# Patient Record
Sex: Female | Born: 1950 | Race: White | Hispanic: No | Marital: Single | Smoking: Former smoker
Health system: Southern US, Community
[De-identification: ages and names within clinical notes are randomized; demographics above are authoritative.]

## PROBLEM LIST (undated history)

## (undated) DIAGNOSIS — E079 Disorder of thyroid, unspecified: Secondary | ICD-10-CM

## (undated) DIAGNOSIS — Z87440 Personal history of urinary (tract) infections: Secondary | ICD-10-CM

## (undated) DIAGNOSIS — R51 Headache: Secondary | ICD-10-CM

## (undated) DIAGNOSIS — E049 Nontoxic goiter, unspecified: Secondary | ICD-10-CM

## (undated) DIAGNOSIS — Z923 Personal history of irradiation: Secondary | ICD-10-CM

## (undated) DIAGNOSIS — C50919 Malignant neoplasm of unspecified site of unspecified female breast: Secondary | ICD-10-CM

## (undated) DIAGNOSIS — Z1379 Encounter for other screening for genetic and chromosomal anomalies: Secondary | ICD-10-CM

## (undated) DIAGNOSIS — R112 Nausea with vomiting, unspecified: Secondary | ICD-10-CM

## (undated) DIAGNOSIS — Z9221 Personal history of antineoplastic chemotherapy: Secondary | ICD-10-CM

## (undated) DIAGNOSIS — Z9889 Other specified postprocedural states: Secondary | ICD-10-CM

## (undated) HISTORY — PX: BREAST BIOPSY: SHX20

## (undated) HISTORY — DX: Encounter for other screening for genetic and chromosomal anomalies: Z13.79

---

## 1983-12-19 HISTORY — PX: TUMOR REMOVAL: SHX12

## 1984-12-18 HISTORY — PX: CERVICAL ABLATION: SHX5771

## 2016-03-18 DIAGNOSIS — R519 Headache, unspecified: Secondary | ICD-10-CM

## 2016-03-18 HISTORY — DX: Headache, unspecified: R51.9

## 2016-04-13 DIAGNOSIS — R928 Other abnormal and inconclusive findings on diagnostic imaging of breast: Secondary | ICD-10-CM | POA: Diagnosis not present

## 2016-04-20 DIAGNOSIS — C50919 Malignant neoplasm of unspecified site of unspecified female breast: Secondary | ICD-10-CM

## 2016-04-20 DIAGNOSIS — C50411 Malignant neoplasm of upper-outer quadrant of right female breast: Secondary | ICD-10-CM | POA: Diagnosis not present

## 2016-04-20 DIAGNOSIS — N63 Unspecified lump in breast: Secondary | ICD-10-CM | POA: Diagnosis not present

## 2016-04-20 HISTORY — DX: Malignant neoplasm of unspecified site of unspecified female breast: C50.919

## 2016-05-10 ENCOUNTER — Other Ambulatory Visit: Payer: Self-pay | Admitting: General Surgery

## 2016-05-10 DIAGNOSIS — C50411 Malignant neoplasm of upper-outer quadrant of right female breast: Secondary | ICD-10-CM | POA: Diagnosis not present

## 2016-05-10 DIAGNOSIS — E785 Hyperlipidemia, unspecified: Secondary | ICD-10-CM | POA: Diagnosis not present

## 2016-05-10 DIAGNOSIS — E049 Nontoxic goiter, unspecified: Secondary | ICD-10-CM | POA: Diagnosis not present

## 2016-05-11 ENCOUNTER — Encounter: Payer: Self-pay | Admitting: Hematology and Oncology

## 2016-05-12 ENCOUNTER — Telehealth: Payer: Self-pay | Admitting: *Deleted

## 2016-05-12 ENCOUNTER — Encounter: Payer: Self-pay | Admitting: Hematology and Oncology

## 2016-05-12 ENCOUNTER — Other Ambulatory Visit: Payer: Self-pay | Admitting: General Surgery

## 2016-05-12 DIAGNOSIS — C50411 Malignant neoplasm of upper-outer quadrant of right female breast: Secondary | ICD-10-CM

## 2016-05-12 NOTE — Telephone Encounter (Signed)
Received appt date/time from Isaiah Blakes.  Placed a note for an intake form to be given to the pt at time of check in.

## 2016-05-12 NOTE — Progress Notes (Signed)
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 Renelda Loma Ingram,MD 05/26/16 scheduled for Right breast Lumpectomy with radioactive seed localization and right axillary   Past/Anticipated interventions by medical oncology, if any: Chemotherapy :Dr. Lindi Adie to see patient 05/18/16 @ 3:45pm  Lymphedema issues, if any:  NO  Pain issues, if any: NO  SAFETY ISSUES: NO  Prior radiation?  NO  Pacemaker/ICD? NO  Possible current pregnancy? NO  Is the patient on methotrexate? NO  Current Complaints / other details: Single,  Menarche age 20, G50PO, hx oral contraceptives, drinks alcohol moderate use, former cigarette smoker,1/2ppd,  quit 33 years ago, Father prostate cancer, Mother,HTN Maternal grandmother  Ovarian cancer ,Great Maternal  aunt breast cancer,  C50.411   Allergies: NKA Rebecca Eaton, RN 05/12/2016,3:00 PM BP 134/66 mmHg  Pulse 59  Temp(Src) 98.5 F (36.9 C) (Oral)  Resp 20  Ht 5' 7"  (1.702 m)  Wt 174 lb 12.8 oz (79.289 kg)  BMI 27.37 kg/m2  LMP  (LMP Unknown)  Wt Readings from Last 3 Encounters:  05/16/16 174 lb 12.8 oz (79.289 kg)

## 2016-05-16 ENCOUNTER — Encounter: Payer: Self-pay | Admitting: Radiation Oncology

## 2016-05-16 ENCOUNTER — Ambulatory Visit
Admission: RE | Admit: 2016-05-16 | Discharge: 2016-05-16 | Disposition: A | Discharge status: Home / Self Care | Payer: PPO | Source: Ambulatory Visit | Attending: Radiation Oncology | Admitting: Radiation Oncology

## 2016-05-16 VITALS — BP 134/66 | HR 59 | Temp 98.5°F | Resp 20 | Ht 67.0 in | Wt 174.8 lb

## 2016-05-16 DIAGNOSIS — C50411 Malignant neoplasm of upper-outer quadrant of right female breast: Secondary | ICD-10-CM | POA: Insufficient documentation

## 2016-05-16 DIAGNOSIS — Z170 Estrogen receptor positive status [ER+]: Secondary | ICD-10-CM | POA: Diagnosis not present

## 2016-05-16 DIAGNOSIS — E079 Disorder of thyroid, unspecified: Secondary | ICD-10-CM | POA: Diagnosis not present

## 2016-05-16 DIAGNOSIS — Z87891 Personal history of nicotine dependence: Secondary | ICD-10-CM | POA: Insufficient documentation

## 2016-05-16 DIAGNOSIS — C50911 Malignant neoplasm of unspecified site of right female breast: Secondary | ICD-10-CM | POA: Diagnosis not present

## 2016-05-16 HISTORY — DX: Nontoxic goiter, unspecified: E04.9

## 2016-05-16 HISTORY — DX: Disorder of thyroid, unspecified: E07.9

## 2016-05-16 HISTORY — DX: Malignant neoplasm of unspecified site of unspecified female breast: C50.919

## 2016-05-16 NOTE — Progress Notes (Signed)
Please see the Nurse Progress Note in the MD Initial Consult Encounter for this patient. 

## 2016-05-16 NOTE — Addendum Note (Signed)
Encounter addended by: Doreen Beam, RN on: 05/16/2016 12:23 PM<BR>     Documentation filed: Charges VN

## 2016-05-16 NOTE — Progress Notes (Signed)
Radiation Oncology         [phone] ________________________________  Name: Mercedes Nelson MRN: [id number]  Date: 05/16/2016  DOB: 1951-01-26  CC:No primary care provider on file.  Fanny Skates, MD     REFERRING PHYSICIAN: Fanny Skates, MD   DIAGNOSIS: Clinical Stage Ia, T1c, N0, M0, ER/PR positive, HER-2 negative, invasive ductal carcinoma of the right breast.   HISTORY OF PRESENT ILLNESS: Mercedes Nelson is a 65 y.o. female seen at the request of Dr. Dalbert Batman for a new diagnosis of breast cancer. The patient has never had an abnormality on her mammography. On 4/191/17 she went for a bilateral screening mammogram which revealed an anomaly of her right breast. She underwent spot compression and ultrasound, the results of which are gleaned from Dr. Darrel Hoover note. She was found to have a 1.1 cm lesion in the right upper outer quadrant of the right breast on ultrasound without axillary adenopathy. She had a biopsy on 04/20/16 which showed ER/PR positive, HER-2 negative, invasive ductal carcinoma, and she has met with Dr. Dalbert Batman and will undergo seed localization, right lumpectomy and sentinel lymph node assessment. She is scheduled for this on 05/26/16. She comes today to discuss the role of radiotherapy with Dr. Lisbeth Renshaw.    PREVIOUS RADIATION THERAPY: No   PAST MEDICAL HISTORY:  Past Medical History  Diagnosis Date  . Thyroid disease   . Breast cancer ([place]) 54/17    Right breast invasive ductal ca  . Goiter        PAST SURGICAL HISTORY:No past surgical history on file.   FAMILY HISTORY:  Family History  Problem Relation Age of Onset  . Hypertension Mother   . Hypertension Father   . Hypertension Sister   . Hypertension Brother   . Cancer Brother     prostate  . Cancer Maternal Grandmother     ovarian  . Cancer Other     breast     SOCIAL HISTORY:  reports that she has quit smoking. Her smoking use included Cigarettes. She has a 2 pack-year smoking history. She has  never used smokeless tobacco. She reports that she drinks alcohol.   ALLERGIES: Review of patient's allergies indicates no known allergies.   MEDICATIONS:  Current Outpatient Prescriptions  Medication Sig Dispense Refill  . Multiple Vitamin (MULTIVITAMIN) tablet Take 1 tablet by mouth daily.    . naproxen sodium (ANAPROX) 220 MG tablet Take 220 mg by mouth as needed.    . simvastatin (ZOCOR) 40 MG tablet Take 40 mg by mouth daily at 6 PM.     No current facility-administered medications for this encounter.     REVIEW OF SYSTEMS: On review of systems, the patient reports that she is doing well overall. She denies any breast pain, nipple bleeding, discharge, or significant bruising. She denies any chest pain, shortness of breath, cough, fevers, chills, night sweats, unintended weight changes. She denies any bowel or bladder disturbances, and denies abdominal pain, nausea or vomiting. She denies any new musculoskeletal or joint aches or pains. A complete review of systems is obtained and is otherwise negative.     PHYSICAL EXAM:  height is _0  (1.702 m) and weight is 174 lb 12.8 oz (79.289 kg). Her oral temperature is 98.5 F (36.9 C). Her blood pressure is 134/66 and her pulse is 59. Her respiration is 20.   Pain scale 0/10 In general this is a well appearing Caucasian female in no acute distress. She is alert and oriented x4  and appropriate throughout the examination. HEENT reveals that the patient is normocephalic, atraumatic. EOMs are intact. PERRLA. Skin is intact without any evidence of gross lesions. Cardiovascular exam reveals a regular rate and rhythm, no clicks rubs or murmurs are auscultated. Chest is clear to auscultation bilaterally. Lymphatic assessment is performed and does not reveal any adenopathy in the cervical, supraclavicular, axillary, or inguinal chains. Bilateral breasts are examined. The right breast reveals postbiopsy change without ecchymosis of the right upper outer  quadrant. No palpable masses are noted. The left breast is also examined and does not have any palpable abnormalities. No bleeding or nipple discharge is noted of either breast. Abdomen has active bowel sounds in all quadrants and is intact. The abdomen is soft, non tender, non distended. Lower extremities are negative for pretibial pitting edema, deep calf tenderness, cyanosis or clubbing.   ECOG = 0  0 - Asymptomatic (Fully active, able to carry on all predisease activities without restriction)  1 - Symptomatic but completely ambulatory (Restricted in physically strenuous activity but ambulatory and able to carry out work of a light or sedentary nature. For example, light housework, office work)  2 - Symptomatic, <50% in bed during the day (Ambulatory and capable of all self care but unable to carry out any work activities. Up and about more than 50% of waking hours)  3 - Symptomatic, >50% in bed, but not bedbound (Capable of only limited self-care, confined to bed or chair 50% or more of waking hours)  4 - Bedbound (Completely disabled. Cannot carry on any self-care. Totally confined to bed or chair)  5 - Death   Eustace Pen MM, Creech RH, Tormey DC, et al. [phone]). "Toxicity and response criteria of the [facility]". [place] Oncol. 5 (6): 649-55    LABORATORY DATA:  No results found for: WBC, HGB, HCT, MCV, PLT No results found for: NA, K, CL, CO2 No results found for: ALT, AST, GGT, ALKPHOS, BILITOT    RADIOGRAPHY: No results found.     IMPRESSION: Clinical Stage Ia, T1c, N0, M0, ER/PR positive, HER-2 negative, invasive ductal carcinoma of the right breast.    PLAN: Dr. Lisbeth Renshaw discusses the pathology findings and reviews the nature of invasive breast disease. He discusses the recommendation for breast conservation surgery as outlined by Dr. Dalbert Batman. He discusses the rationale for radiotherapy to decrease the risk of recurrent disease as well. He discusses that  radiotherapy would also be followed by antiestrogen therapy. The patient will meet with Dr. Lindi Adie Thursday of this week and will discuss whether or not there is an indication for chemotherapy as well. Dr. Lisbeth Renshaw discusses that radiation would follow chemotherapy also if this was indicated. For the radiation treatments,  he would recommend a hypofractionated course of radiation given daily for 20 fractions over 4 weeks. He discusses the risks, benefits, short, and long term effects of treatment. She states agreement and understanding of this plan. We will coordinate her care following surgery.   I have also broached the topic of genetic testing with the patient given her own history of cancer with two family members who had ovarian cancer, and the other with breast cancer. She will consider her options and let us know or Dr. Lindi Adie know if she would like to speak with genetics.  The above documentation reflects my direct findings during this shared patient visit. Please see the separate note by Dr. Lisbeth Renshaw on this date for the remainder of the patient's plan of care.  Carola Rhine, PAC

## 2016-05-18 ENCOUNTER — Encounter: Payer: Self-pay | Admitting: Hematology and Oncology

## 2016-05-18 ENCOUNTER — Encounter: Payer: Self-pay | Admitting: *Deleted

## 2016-05-18 ENCOUNTER — Ambulatory Visit (HOSPITAL_BASED_OUTPATIENT_CLINIC_OR_DEPARTMENT_OTHER): Payer: PPO | Admitting: Hematology and Oncology

## 2016-05-18 ENCOUNTER — Other Ambulatory Visit: Payer: Self-pay

## 2016-05-18 VITALS — BP 123/62 | HR 60 | Temp 98.6°F | Resp 19 | Ht 67.0 in | Wt 172.9 lb

## 2016-05-18 DIAGNOSIS — C50411 Malignant neoplasm of upper-outer quadrant of right female breast: Secondary | ICD-10-CM

## 2016-05-18 DIAGNOSIS — Z170 Estrogen receptor positive status [ER+]: Secondary | ICD-10-CM

## 2016-05-18 NOTE — Assessment & Plan Note (Signed)
04/05/2016: Asymmetry in the right breast lateral middle depth, 11 mm non-circumscribed mass ([facility] mammogram) Right breast biopsy: Invasive ductal carcinoma ER 90-100%, PR 20%, Ki-67 20%, HER-2 Neg, T1b N0 stage IA clinical stage  Pathology and radiology counseling:Discussed with the patient, the details of pathology including the type of breast cancer,the clinical staging, the significance of ER, PR and HER-2/neu receptors and the implications for treatment. After reviewing the pathology in detail, we proceeded to discuss the different treatment options between surgery, radiation, chemotherapy, antiestrogen therapies.  Recommendations: Genetics consultation (maternal grandmother ovarian cancer) 1. Breast conserving surgery followed by 2. Oncotype DX testing to determine if chemotherapy would be of any benefit followed by 3. Adjuvant radiation therapy followed by 4. Adjuvant antiestrogen therapy  Oncotype counseling: I discussed Oncotype DX test. I explained to the patient that this is a 21 gene panel to evaluate patient tumors DNA to calculate recurrence score. This would help determine whether patient has high risk or intermediate risk or low risk breast cancer. She understands that if her tumor was found to be high risk, she would benefit from systemic chemotherapy. If low risk, no need of chemotherapy. If she was found to be intermediate risk, we would need to evaluate the score as well as other risk factors and determine if an abbreviated chemotherapy may be of benefit.  Return to clinic after surgery to discuss final pathology report and then determine if Oncotype DX testing will need to be sent.

## 2016-05-18 NOTE — Progress Notes (Signed)
She is at [place]  Patient Care Team: Pcp Not In System as PCP - General   Consulting physician: Dr. Dalbert Batman  CHIEF COMPLAINTS/PURPOSE OF CONSULTATION:  Newly diagnosed breast cancer  HISTORY OF PRESENTING ILLNESS:  Mercedes Nelson 65 y.o. female is here because of recent diagnosis of right breast cancer. Patient had a routine screening mammogram that revealed an asymmetry in the right breast it was 11 mm by ultrasound. Ultrasound-guided biopsy was performed which revealed invasive ductal carcinoma that was ER/PR positive HER-2 negative with a Ki-67 of 20%. She was seen by Dr. Dalbert Batman who consented her for lumpectomy surgery. She is here to discuss overall treatment plan. 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 I reviewed her records extensively and collaborated the history with the patient.  SUMMARY OF ONCOLOGIC HISTORY:   Malignant neoplasm of upper-outer quadrant of right female breast ([place])   04/05/2016 Mammogram Asymmetry in the right breast lateral middle depth, 11 mm non-circumscribed mass ([facility] mammogram)   04/05/2016 Initial Diagnosis Right breast biopsy: Invasive ductal carcinoma ER 90-100%, PR 20%, Ki-67 20%, HER-2 Neg, T1b N0 stage IA clinical stage   MEDICAL HISTORY:  Past Medical History  Diagnosis Date  . Thyroid disease   . Breast cancer ([place]) 04/20/16    Right breast invasive ductal ca  . Goiter     SURGICAL HISTORY: No past surgical history on file.  SOCIAL HISTORY: Social History   Social History  . Marital Status: Single    Spouse Name: N/A  . Number of Children: N/A  . Years of Education: N/A   Occupational History  . retired     Former Corporate treasurer   Social History Main Topics  . Smoking status: Former Smoker -- 0.50 packs/day for 4 years    Types: Cigarettes  . Smokeless tobacco: Never Used     Comment: quit 33 years ago  . Alcohol Use: 0.0 oz/week    0  Standard drinks or equivalent per week     Comment: glass wine or beer daily  . Drug Use: No  . Sexual Activity: Not on file   Other Topics Concern  . Not on file   Social History Narrative    FAMILY HISTORY: Family History  Problem Relation Age of Onset  . Hypertension Mother   . Hypertension Father   . Hypertension Sister   . Hypertension Brother   . Cancer Father     prostate  . Cancer Maternal Grandmother     ovarian  . Cancer Other     breast    ALLERGIES:  has No Known Allergies.  MEDICATIONS:  Current Outpatient Prescriptions  Medication Sig Dispense Refill  . Multiple Vitamin (MULTIVITAMIN) tablet Take 1 tablet by mouth daily.    . naproxen sodium (ANAPROX) 220 MG tablet Take 220 mg by mouth as needed.    . simvastatin (ZOCOR) 40 MG tablet Take 40 mg by mouth daily at 6 PM.     No current facility-administered medications for this visit.    REVIEW OF SYSTEMS:   Constitutional: Denies fevers, chills or abnormal night sweats Eyes: Denies blurriness of vision, double vision or watery eyes Ears, nose, mouth, throat, and face: Denies mucositis or sore throat Respiratory: Denies cough, dyspnea or wheezes Cardiovascular: Denies palpitation, chest discomfort or lower extremity swelling Gastrointestinal:  Denies nausea, heartburn or change in bowel habits Skin: Denies abnormal skin rashes Lymphatics: Denies new lymphadenopathy  or easy bruising Neurological:Denies numbness, tingling or new weaknesses Behavioral/Psych: Mood is stable, no new changes  Breast:  Denies any palpable lumps or discharge All other systems were reviewed with the patient and are negative.  PHYSICAL EXAMINATION: ECOG PERFORMANCE STATUS: 0 - Asymptomatic  Filed Vitals:   05/18/16 1250  BP: 123/62  Pulse: 60  Temp: 98.6 F (37 C)  Resp: 19   Filed Weights   05/18/16 1250  Weight: 172 lb 14.4 oz (78.427 kg)    GENERAL:alert, no distress and comfortable SKIN: skin color, texture,  turgor are normal, no rashes or significant lesions EYES: normal, conjunctiva are pink and non-injected, sclera clear OROPHARYNX:no exudate, no erythema and lips, buccal mucosa, and tongue normal  NECK: supple, thyroid normal size, non-tender, without nodularity LYMPH:  no palpable lymphadenopathy in the cervical, axillary or inguinal LUNGS: clear to auscultation and percussion with normal breathing effort HEART: regular rate & rhythm and no murmurs and no lower extremity edema ABDOMEN:abdomen soft, non-tender and normal bowel sounds Musculoskeletal:no cyanosis of digits and no clubbing  PSYCH: alert & oriented x 3 with fluent speech NEURO: no focal motor/sensory deficits BREAST: No palpable nodules in breast. No palpable axillary or supraclavicular lymphadenopathy (exam performed in the presence of a chaperone)   RADIOGRAPHIC STUDIES: I have personally reviewed the radiological reports and agreed with the findings in the report.  ASSESSMENT AND PLAN:  Malignant neoplasm of upper-outer quadrant of right female breast ([name]) 04/05/2016: Asymmetry in the right breast lateral middle depth, 11 mm non-circumscribed mass ([facility] mammogram) Right breast biopsy: Invasive ductal carcinoma ER 90-100%, PR 20%, Ki-67 20%, HER-2 Neg, T1b N0 stage IA clinical stage  Pathology and radiology counseling:Discussed with the patient, the details of pathology including the type of breast cancer,the clinical staging, the significance of ER, PR and HER-2/neu receptors and the implications for treatment. After reviewing the pathology in detail, we proceeded to discuss the different treatment options between surgery, radiation, chemotherapy, antiestrogen therapies.  Recommendations: Genetics consultation (maternal grandmother ovarian cancer) 1. Breast conserving surgery followed by 2. Oncotype DX testing to determine if chemotherapy would be of any benefit followed by 3. Adjuvant radiation therapy followed by 4.  Adjuvant antiestrogen therapy  Oncotype counseling: I discussed Oncotype DX test. I explained to the patient that this is a 21 gene panel to evaluate patient tumors DNA to calculate recurrence score. This would help determine whether patient has high risk or intermediate risk or low risk breast cancer. She understands that if her tumor was found to be high risk, she would benefit from systemic chemotherapy. If low risk, no need of chemotherapy. If she was found to be intermediate risk, we would need to evaluate the score as well as other risk factors and determine if an abbreviated chemotherapy may be of benefit.  Return to clinic after surgery to discuss final pathology report and then determine if Oncotype DX testing will need to be sent.  All questions were answered. The patient knows to call the clinic with any problems, questions or concerns.    Rulon Eisenmenger, MD 05/18/2016

## 2016-05-19 NOTE — Progress Notes (Signed)
Note created by Dr. Gudena during office visit, copy to patient,original to scan. 

## 2016-05-21 NOTE — H&P (Signed)
Mercedes Nelson. Hall Busing  Location: [facility] Patient #: [id number] DOB: Jul 26, 1951 Single / Language: Undefined / Race: White Female       History of Present Illness This is a 65 year old Caucasian female, referred to me by her PCP Dr. Barbette Hair and for evaluation of a new right breast cancer.  The patient used to live in [place] and lives here now. 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 She gets them annually. 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 Her insurance requires her to go to come hospital and asked what she wants to do because she lives in [place] now.  Past history is significant for hyperlipidemia. She's had a goiter all of her life and states that her thyroid function tests are normal and ultrasounds have showed no nodules in the past.  Family history reveals ovarian cancer in a maternal grandmother. Breast cancer in a great aunt. No pancreatic or prostate cancer.  Social history reveals she is single and lives in [place] has no children lives alone. A sister and a mother living [place]. She is a retired Corporate treasurer. Denies tobacco. Has 1 alcoholic beverage a day. She has a vacation planned from June 24 through July 1.  We spent a long time talking about her cancer. I told her that clinically she was a stage IA cancer. The doesn't seem to be any indication for MRI. We are going to send for her baseline screening imaging studies, [facility] read. We will get her last note from her primary care physician. Iris Pert, RN at the [facility] has been contacted and will refer her to one of our medical oncologists and one of our radiation  oncologists.  We had a long talk about management of breast cancer in general and hers in detail. She we talked whether diverting lumpectomy and mastectomy. We talked about reconstruction. We talked about sentinel lymph node biopsy. She is essentially lumpectomy and I think she is an excellent candidate for that. She will be tentatively scheduled for right breast lumpectomy with radioactive seed localization and right axillary sentinel lymph node biopsy. In the meantime we'll get a preoperative medical oncology and radiation oncology consultation. We'll check her TSH preop.   Other Problems  Breast Cancer General anesthesia - complications Hypercholesterolemia Lump In Breast Migraine Headache Thyroid Disease  Past Surgical History Breast Biopsy Right.  Diagnostic Studies History  Colonoscopy 1-5 years ago Mammogram within last year Pap Smear 1-5 years ago  Allergies  No Known Drug Allergies05/24/2017  Medication History  Simvastatin (40MG Tablet, Oral) Active. Medications Reconciled  Social History  Alcohol use Moderate alcohol use. Caffeine use Coffee. No drug use Tobacco use Former smoker.  Family History  Heart Disease Father. Hypertension Brother, Father, Mother, Sister. Prostate Cancer Father.  Pregnancy / Birth History  Age at menarche 48 years. Age of menopause 51-55 Contraceptive History Oral contraceptives. Gravida 0 Irregular periods Para 0    Review of Systems General Not Present- Appetite Loss, Chills, Fatigue, Fever, Night Sweats, Weight Gain and Weight Loss. Skin Not Present- Change in Wart/Mole, Dryness, Hives, Jaundice, New Lesions, Non-Healing Wounds, Rash and Ulcer. HEENT Present- Wears glasses/contact lenses. Not Present- Earache, Hearing Loss, Hoarseness, Nose Bleed, Oral  Ulcers, Ringing in the Ears, Seasonal Allergies, Sinus Pain, Sore Throat, Visual Disturbances and Yellow Eyes. Respiratory Not Present- Bloody  sputum, Chronic Cough, Difficulty Breathing, Snoring and Wheezing. Breast Present- Breast Mass. Not Present- Breast Pain, Nipple Discharge and Skin Changes. Cardiovascular Not Present- Chest Pain, Difficulty Breathing Lying Down, Leg Cramps, Palpitations, Rapid Heart Rate, Shortness of Breath and Swelling of Extremities. Gastrointestinal Not Present- Abdominal Pain, Bloating, Bloody Stool, Change in Bowel Habits, Chronic diarrhea, Constipation, Difficulty Swallowing, Excessive gas, Gets full quickly at meals, Hemorrhoids, Indigestion, Nausea, Rectal Pain and Vomiting. Female Genitourinary Not Present- Frequency, Nocturia, Painful Urination, Pelvic Pain and Urgency. Musculoskeletal Not Present- Back Pain, Joint Pain, Joint Stiffness, Muscle Pain, Muscle Weakness and Swelling of Extremities. Neurological Not Present- Decreased Memory, Fainting, Headaches, Numbness, Seizures, Tingling, Tremor, Trouble walking and Weakness. Psychiatric Not Present- Anxiety, Bipolar, Change in Sleep Pattern, Depression, Fearful and Frequent crying. Endocrine Not Present- Cold Intolerance, Excessive Hunger, Hair Changes, Heat Intolerance, Hot flashes and New Diabetes. Hematology Not Present- Easy Bruising, Excessive bleeding, Gland problems, HIV and Persistent Infections.  Vitals  Weight: 172 lb Height: 67in Body Surface Area: 1.9 m Body Mass Index: 26.94 kg/m  Temp.: 97F(Temporal)  Pulse: 78 (Regular)  BP: 120/70 (Sitting, Left Arm, Standard)     Physical Exam  General Mental Status-Alert. General Appearance-Consistent with stated age. Hydration-Well hydrated. Voice-Normal. Note: Very pleasant and cooperative. Very soft spoken.   Head and Neck Head-normocephalic, atraumatic with no lesions or palpable masses. Trachea-midline. Thyroid Gland Characteristics - normal size and consistency.  Eye Eyeball - Bilateral-Extraocular movements intact. Sclera/Conjunctiva -  Bilateral-No scleral icterus.  Chest and Lung Exam Chest and lung exam reveals -quiet, even and easy respiratory effort with no use of accessory muscles and on auscultation, normal breath sounds, no adventitious sounds and normal vocal resonance. Inspection Chest Wall - Normal. Back - normal.  Breast Note: Medium size breasts. Biopsy scar laterally. Slight thickening at the 11 o'clock position possibly the tumor or possibly a small hematoma. No other mass or skin changes in either breast. No axillary adenopathy.   Cardiovascular Cardiovascular examination reveals -normal heart sounds, regular rate and rhythm with no murmurs and normal pedal pulses bilaterally.  Abdomen Inspection Inspection of the abdomen reveals - No Hernias. Skin - Scar - no surgical scars. Palpation/Percussion Palpation and Percussion of the abdomen reveal - Soft, Non Tender, No Rebound tenderness, No Rigidity (guarding) and No hepatosplenomegaly. Auscultation Auscultation of the abdomen reveals - Bowel sounds normal.  Neurologic Neurologic evaluation reveals -alert and oriented x 3 with no impairment of recent or remote memory. Mental Status-Normal.  Musculoskeletal Normal Exam - Left-Upper Extremity Strength Normal and Lower Extremity Strength Normal. Normal Exam - Right-Upper Extremity Strength Normal and Lower Extremity Strength Normal.  Lymphatic Head & Neck  General Head & Neck Lymphatics: Bilateral - Description - Normal. Axillary  General Axillary Region: Bilateral - Description - Normal. Tenderness - Non Tender. Femoral & Inguinal  Generalized Femoral & Inguinal Lymphatics: Bilateral - Description - Normal. Tenderness - Non Tender.    Assessment & Plan  BREAST CANCER OF UPPER-OUTER QUADRANT OF RIGHT FEMALE BREAST (C50.411)    Your recent imaging studies and biopsies in [facility] sure that you have a 1.1 cm invasive ductal carcinoma in the right breast, upper outer  quadrant quadrant. Ultrasound of your right axillary lymph nodes looked normal. Your tumor is positive for estrogen and progesterone receptor sensitivity, and a HER-2 test is probably negative.  We have talked about options for surgical  intervention which include lumpectomy or mastectomy, sentinel lymph node biopsy. We have talked about radiation therapy and medical oncology referral. After discussing this at length, we have agreed to schedule you for right breast lumpectomy with radioactive seed localization and right axillary sentinel lymph node biopsy We have discussed the indications, techniques, and numerous risks of the surgery  prior to the surgery we are going to obtain the bilateral screening mammograms and your pathology slides from [facility]. We are also going to refer you to medical oncology and radiation oncology for a preoperative consultation  Please read over the printed information that we gave you  HYPERLIPIDEMIA, MILD (E78.5) GOITER (E04.9) Impression: Euthyroid by history.   Edsel Petrin. Dalbert Batman, M.D., [facility] General and Minimally invasive Surgery Breast and Colorectal Surgery Office:   [phone] Pager:   [phone]

## 2016-05-22 ENCOUNTER — Other Ambulatory Visit: Payer: Self-pay | Admitting: General Surgery

## 2016-05-22 DIAGNOSIS — C50411 Malignant neoplasm of upper-outer quadrant of right female breast: Secondary | ICD-10-CM | POA: Diagnosis not present

## 2016-05-24 ENCOUNTER — Encounter (HOSPITAL_COMMUNITY): Payer: Self-pay

## 2016-05-24 ENCOUNTER — Encounter (HOSPITAL_COMMUNITY)
Admission: RE | Admit: 2016-05-24 | Discharge: 2016-05-24 | Disposition: A | Discharge status: Home / Self Care | Payer: PPO | Source: Ambulatory Visit | Attending: General Surgery | Admitting: General Surgery

## 2016-05-24 DIAGNOSIS — C50411 Malignant neoplasm of upper-outer quadrant of right female breast: Secondary | ICD-10-CM | POA: Diagnosis not present

## 2016-05-24 DIAGNOSIS — Z87891 Personal history of nicotine dependence: Secondary | ICD-10-CM | POA: Diagnosis not present

## 2016-05-24 DIAGNOSIS — E7800 Pure hypercholesterolemia, unspecified: Secondary | ICD-10-CM | POA: Diagnosis not present

## 2016-05-24 DIAGNOSIS — Z170 Estrogen receptor positive status [ER+]: Secondary | ICD-10-CM | POA: Diagnosis not present

## 2016-05-24 DIAGNOSIS — Z8041 Family history of malignant neoplasm of ovary: Secondary | ICD-10-CM | POA: Diagnosis not present

## 2016-05-24 DIAGNOSIS — Z803 Family history of malignant neoplasm of breast: Secondary | ICD-10-CM | POA: Diagnosis not present

## 2016-05-24 HISTORY — DX: Headache: R51

## 2016-05-24 HISTORY — DX: Other specified postprocedural states: R11.2

## 2016-05-24 HISTORY — DX: Other specified postprocedural states: Z98.890

## 2016-05-24 LAB — COMPREHENSIVE METABOLIC PANEL
ALBUMIN: 3.9 g/dL (ref 3.5–5.0)
ALT: 24 U/L (ref 14–54)
AST: 22 U/L (ref 15–41)
Alkaline Phosphatase: 59 U/L (ref 38–126)
Anion gap: 7 (ref 5–15)
BUN: 15 mg/dL (ref 6–20)
CHLORIDE: 106 mmol/L (ref 101–111)
CO2: 27 mmol/L (ref 22–32)
Calcium: 9.6 mg/dL (ref 8.9–10.3)
Creatinine, Ser: 0.79 mg/dL (ref 0.44–1.00)
GFR calc Af Amer: >60 mL/min (ref >60)
GLUCOSE: 104 mg/dL — AB (ref 65–99)
POTASSIUM: 4 mmol/L (ref 3.5–5.1)
Sodium: 140 mmol/L (ref 135–145)
Total Bilirubin: 1.2 mg/dL (ref 0.3–1.2)
Total Protein: 6.1 g/dL — ABNORMAL LOW (ref 6.5–8.1)

## 2016-05-24 LAB — CBC WITH DIFFERENTIAL/PLATELET
BASOS ABS: 0 10*3/uL (ref 0.0–0.1)
BASOS PCT: 0 %
EOS PCT: 1 %
Eosinophils Absolute: 0 10*3/uL (ref 0.0–0.7)
HCT: 42.8 % (ref 36.0–46.0)
Hemoglobin: 13.8 g/dL (ref 12.0–15.0)
Lymphocytes Relative: 16 %
Lymphs Abs: 1.4 10*3/uL (ref 0.7–4.0)
MCH: 29 pg (ref 26.0–34.0)
MCHC: 32.2 g/dL (ref 30.0–36.0)
MCV: 89.9 fL (ref 78.0–100.0)
MONO ABS: 0.4 10*3/uL (ref 0.1–1.0)
Monocytes Relative: 5 %
Neutro Abs: 6.8 10*3/uL (ref 1.7–7.7)
Neutrophils Relative %: 78 %
PLATELETS: 214 10*3/uL (ref 150–400)
RBC: 4.76 MIL/uL (ref 3.87–5.11)
RDW: 12.8 % (ref 11.5–15.5)
WBC: 8.7 10*3/uL (ref 4.0–10.5)

## 2016-05-24 LAB — TSH: TSH: 0.888 u[IU]/mL (ref 0.350–4.500)

## 2016-05-24 NOTE — Pre-Procedure Instructions (Signed)
Mercedes Nelson  05/24/2016      [id number] [place], [place] [address] AT [address] [place] [address] Phone: [phone] Fax: [phone]    Your procedure is scheduled on June 9  Report to [place] at [employer].M.  Call this number if you have problems the morning of surgery:  [phone]   Remember:  Do not eat food or drink liquids after midnight.  Take these medicines the morning of surgery with A SIP OF WATER-NA  Stop taking Vitamins, Herbal medications, BC's, Goody's, Ibuprofen, Advil, motrin, Aleve, Naproxen     Do not wear jewelry, make-up or nail polish.  Do not wear lotions, powders, or perfumes.  You may wear deodorant.  Do not shave 48 hours prior to surgery.  Men may shave face and neck.  Do not bring valuables to the hospital.  [facility] is not responsible for any belongings or valuables.  Contacts, dentures or bridgework may not be worn into surgery.  Leave your suitcase in the car.  After surgery it may be brought to your room.  For patients admitted to the hospital, discharge time will be determined by your treatment team.  Patients discharged the day of surgery will not be allowed to drive home.    Special instructions:  [place] - Preparing for Surgery  Before surgery, you can play an important role.  Because skin is not sterile, your skin needs to be as free of germs as possible.  You can reduce the number of germs on you skin by washing with CHG (chlorahexidine gluconate) soap before surgery.  CHG is an antiseptic cleaner which kills germs and bonds with the skin to continue killing germs even after washing.  Please DO NOT use if you have an allergy to CHG or antibacterial soaps.  If your skin becomes reddened/irritated stop using the CHG and inform your nurse when you arrive at Short Stay.  Do not shave (including legs and underarms) for at least 48 hours  prior to the first CHG shower.  You may shave your face.  Please follow these instructions carefully:   1.  Shower with CHG Soap the night before surgery and the    morning of Surgery.  2.  If you choose to wash your hair, wash your hair first as usual with your normal shampoo.  3.  After you shampoo, rinse your hair and body thoroughly to remove the  Shampoo.  4.  Use CHG as you would any other liquid soap.  You can apply chg directly  to the skin and wash gently with scrungie or a clean washcloth.  5.  Apply the CHG Soap to your body ONLY FROM THE NECK DOWN.   Do not use on open wounds or open sores.  Avoid contact with your eyes,  ears, mouth and genitals (private parts).  Wash genitals (private parts)       with your normal soap.  6.  Wash thoroughly, paying special attention to the area where your surgery   will be performed.  7.  Thoroughly rinse your body with warm water from the neck down.  8.  DO NOT shower/wash with your normal soap after using and rinsing off  the CHG Soap.  9.  Pat yourself dry with a clean towel.            10.  Wear clean pajamas.  11.  Place clean sheets on your bed the night of your first shower and do not sleep with pets.  Day of Surgery  Do not apply any lotions/deoderants the morning of surgery.  Please wear clean clothes to the hospital/surgery center.     Please read over the following fact sheets that you were given. Pain Booklet, Coughing and Deep Breathing and Surgical Site Infection Prevention

## 2016-05-24 NOTE — Progress Notes (Addendum)
PCP: Dr. Barbette Hair in [facility]  Pt with no cardiologist currently. Pt states she was worked up by a cardiologist in 1999. Echo and stress test were performed at this time and normal.   Pt requesting nausea patch for procedure and states she will speak with anesthesia about patch day of surgery.

## 2016-05-25 ENCOUNTER — Ambulatory Visit
Admission: RE | Admit: 2016-05-25 | Discharge: 2016-05-25 | Disposition: A | Discharge status: Home / Self Care | Payer: PPO | Source: Ambulatory Visit | Attending: General Surgery | Admitting: General Surgery

## 2016-05-25 DIAGNOSIS — C50911 Malignant neoplasm of unspecified site of right female breast: Secondary | ICD-10-CM | POA: Diagnosis not present

## 2016-05-25 DIAGNOSIS — C50411 Malignant neoplasm of upper-outer quadrant of right female breast: Secondary | ICD-10-CM

## 2016-05-26 ENCOUNTER — Encounter (HOSPITAL_COMMUNITY): Payer: Self-pay | Admitting: Certified Registered Nurse Anesthetist

## 2016-05-26 ENCOUNTER — Ambulatory Visit (HOSPITAL_COMMUNITY): Payer: PPO | Admitting: Certified Registered Nurse Anesthetist

## 2016-05-26 ENCOUNTER — Encounter (HOSPITAL_COMMUNITY)
Admission: RE | Disposition: A | Discharge status: Home / Self Care | Payer: Self-pay | Source: Ambulatory Visit | Attending: General Surgery

## 2016-05-26 ENCOUNTER — Ambulatory Visit
Admission: RE | Admit: 2016-05-26 | Discharge: 2016-05-26 | Disposition: A | Discharge status: Home / Self Care | Payer: PPO | Source: Ambulatory Visit | Attending: General Surgery | Admitting: General Surgery

## 2016-05-26 ENCOUNTER — Ambulatory Visit (HOSPITAL_COMMUNITY)
Admission: RE | Admit: 2016-05-26 | Discharge: 2016-05-26 | Disposition: A | Discharge status: Home / Self Care | Payer: PPO | Source: Ambulatory Visit | Attending: General Surgery | Admitting: General Surgery

## 2016-05-26 DIAGNOSIS — Z87891 Personal history of nicotine dependence: Secondary | ICD-10-CM | POA: Insufficient documentation

## 2016-05-26 DIAGNOSIS — Z8041 Family history of malignant neoplasm of ovary: Secondary | ICD-10-CM | POA: Insufficient documentation

## 2016-05-26 DIAGNOSIS — C50411 Malignant neoplasm of upper-outer quadrant of right female breast: Secondary | ICD-10-CM

## 2016-05-26 DIAGNOSIS — Z170 Estrogen receptor positive status [ER+]: Secondary | ICD-10-CM | POA: Insufficient documentation

## 2016-05-26 DIAGNOSIS — Z803 Family history of malignant neoplasm of breast: Secondary | ICD-10-CM | POA: Insufficient documentation

## 2016-05-26 DIAGNOSIS — R928 Other abnormal and inconclusive findings on diagnostic imaging of breast: Secondary | ICD-10-CM | POA: Diagnosis not present

## 2016-05-26 DIAGNOSIS — C50911 Malignant neoplasm of unspecified site of right female breast: Secondary | ICD-10-CM | POA: Diagnosis not present

## 2016-05-26 DIAGNOSIS — E7800 Pure hypercholesterolemia, unspecified: Secondary | ICD-10-CM | POA: Insufficient documentation

## 2016-05-26 HISTORY — PX: BREAST LUMPECTOMY: SHX2

## 2016-05-26 HISTORY — PX: BREAST LUMPECTOMY WITH RADIOACTIVE SEED AND SENTINEL LYMPH NODE BIOPSY: SHX6550

## 2016-05-26 SURGERY — BREAST LUMPECTOMY WITH RADIOACTIVE SEED AND SENTINEL LYMPH NODE BIOPSY
Anesthesia: General | Site: Breast | Laterality: Right

## 2016-05-26 MED ORDER — LIDOCAINE HCL (CARDIAC) 20 MG/ML IV SOLN
INTRAVENOUS | Status: DC | PRN
  Administered 2016-05-26: 60 mg via INTRAVENOUS

## 2016-05-26 MED ORDER — OXYCODONE HCL 5 MG PO TABS
ORAL_TABLET | ORAL | Status: AC
  Filled 2016-05-26: qty 1

## 2016-05-26 MED ORDER — HYDROCODONE-ACETAMINOPHEN 5-325 MG PO TABS: 1.0000 | ORAL_TABLET | Freq: Four times a day (QID) | ORAL | Status: DC | PRN

## 2016-05-26 MED ORDER — FENTANYL CITRATE (PF) 100 MCG/2ML IJ SOLN
50.0000 ug | Freq: Once | INTRAMUSCULAR | Status: AC
  Administered 2016-05-26: 50 ug via INTRAVENOUS

## 2016-05-26 MED ORDER — PROPOFOL 10 MG/ML IV BOLUS
INTRAVENOUS | Status: AC
  Filled 2016-05-26: qty 20

## 2016-05-26 MED ORDER — ONDANSETRON HCL 4 MG/2ML IJ SOLN: 4.0000 mg | Freq: Once | INTRAMUSCULAR | Status: DC | PRN

## 2016-05-26 MED ORDER — OXYCODONE HCL 5 MG/5ML PO SOLN: 5.0000 mg | Freq: Once | ORAL | Status: AC | PRN

## 2016-05-26 MED ORDER — ONDANSETRON HCL 4 MG/2ML IJ SOLN
INTRAMUSCULAR | Status: DC | PRN
  Administered 2016-05-26: 4 mg via INTRAVENOUS

## 2016-05-26 MED ORDER — LACTATED RINGERS IV SOLN
INTRAVENOUS | Status: DC | PRN
  Administered 2016-05-26 (×2): via INTRAVENOUS

## 2016-05-26 MED ORDER — 0.9 % SODIUM CHLORIDE (POUR BTL) OPTIME
TOPICAL | Status: DC | PRN
Start: 2016-05-26 — End: 2016-05-26
  Administered 2016-05-26: 1000 mL

## 2016-05-26 MED ORDER — FENTANYL CITRATE (PF) 100 MCG/2ML IJ SOLN
INTRAMUSCULAR | Status: DC | PRN
  Administered 2016-05-26: 125 ug via INTRAVENOUS
  Administered 2016-05-26 (×2): 50 ug via INTRAVENOUS
  Administered 2016-05-26: 25 ug via INTRAVENOUS

## 2016-05-26 MED ORDER — MIDAZOLAM HCL 2 MG/2ML IJ SOLN
1.0000 mg | Freq: Once | INTRAMUSCULAR | Status: AC
  Administered 2016-05-26: 1 mg via INTRAVENOUS

## 2016-05-26 MED ORDER — SCOPOLAMINE 1 MG/3DAYS TD PT72
1.0000 | MEDICATED_PATCH | Freq: Once | TRANSDERMAL | Status: DC
  Administered 2016-05-26: 1.5 mg via TRANSDERMAL
  Filled 2016-05-26: qty 1

## 2016-05-26 MED ORDER — EPHEDRINE SULFATE 50 MG/ML IJ SOLN
INTRAMUSCULAR | Status: DC | PRN
  Administered 2016-05-26: 10 mg via INTRAVENOUS

## 2016-05-26 MED ORDER — BUPIVACAINE-EPINEPHRINE 0.25% -1:200000 IJ SOLN
INTRAMUSCULAR | Status: DC | PRN
  Administered 2016-05-26: 8.5 mL

## 2016-05-26 MED ORDER — MIDAZOLAM HCL 2 MG/2ML IJ SOLN
INTRAMUSCULAR | Status: AC
  Administered 2016-05-26: 1 mg via INTRAVENOUS
  Filled 2016-05-26: qty 2

## 2016-05-26 MED ORDER — FENTANYL CITRATE (PF) 100 MCG/2ML IJ SOLN
INTRAMUSCULAR | Status: AC
  Administered 2016-05-26: 50 ug via INTRAVENOUS
  Filled 2016-05-26: qty 2

## 2016-05-26 MED ORDER — MIDAZOLAM HCL 2 MG/2ML IJ SOLN
INTRAMUSCULAR | Status: AC
  Filled 2016-05-26: qty 2

## 2016-05-26 MED ORDER — BUPIVACAINE-EPINEPHRINE (PF) 0.25% -1:200000 IJ SOLN
INTRAMUSCULAR | Status: AC
  Filled 2016-05-26: qty 30

## 2016-05-26 MED ORDER — PROPOFOL 10 MG/ML IV BOLUS
INTRAVENOUS | Status: DC | PRN
  Administered 2016-05-26: 170 mg via INTRAVENOUS

## 2016-05-26 MED ORDER — LACTATED RINGERS IV SOLN
Freq: Once | INTRAVENOUS | Status: AC
  Administered 2016-05-26: 10:00:00 via INTRAVENOUS

## 2016-05-26 MED ORDER — TECHNETIUM TC 99M SULFUR COLLOID FILTERED
1.0000 | Freq: Once | INTRAVENOUS | Status: AC | PRN
  Administered 2016-05-26: 1 via INTRADERMAL

## 2016-05-26 MED ORDER — METHYLENE BLUE 0.5 % INJ SOLN
INTRAVENOUS | Status: AC
  Filled 2016-05-26: qty 10

## 2016-05-26 MED ORDER — DEXAMETHASONE SODIUM PHOSPHATE 10 MG/ML IJ SOLN
INTRAMUSCULAR | Status: DC | PRN
  Administered 2016-05-26: 10 mg via INTRAVENOUS

## 2016-05-26 MED ORDER — GLYCOPYRROLATE 0.2 MG/ML IJ SOLN
INTRAMUSCULAR | Status: DC | PRN
  Administered 2016-05-26: 0.2 mg via INTRAVENOUS

## 2016-05-26 MED ORDER — HYDROMORPHONE HCL 1 MG/ML IJ SOLN: 0.2500 mg | INTRAMUSCULAR | Status: DC | PRN

## 2016-05-26 MED ORDER — OXYCODONE HCL 5 MG PO TABS
5.0000 mg | ORAL_TABLET | Freq: Once | ORAL | Status: AC | PRN
  Administered 2016-05-26: 5 mg via ORAL

## 2016-05-26 MED ORDER — LIDOCAINE 2% (20 MG/ML) 5 ML SYRINGE
INTRAMUSCULAR | Status: AC
  Filled 2016-05-26: qty 5

## 2016-05-26 MED ORDER — SODIUM CHLORIDE 0.9 % IJ SOLN
INTRAVENOUS | Status: DC | PRN
  Administered 2016-05-26: 2 mL via INTRAMUSCULAR

## 2016-05-26 MED ORDER — FENTANYL CITRATE (PF) 250 MCG/5ML IJ SOLN
INTRAMUSCULAR | Status: AC
  Filled 2016-05-26: qty 5

## 2016-05-26 MED ORDER — CEFAZOLIN SODIUM-DEXTROSE 2-4 GM/100ML-% IV SOLN
2.0000 g | INTRAVENOUS | Status: AC
  Administered 2016-05-26: 2 g via INTRAVENOUS
  Filled 2016-05-26: qty 100

## 2016-05-26 SURGICAL SUPPLY — 48 items
ADH SKN CLS APL DERMABOND .7 (GAUZE/BANDAGES/DRESSINGS) ×1
APPLIER CLIP 9.375 MED OPEN (MISCELLANEOUS) ×3
APR CLP MED 9.3 20 MLT OPN (MISCELLANEOUS) ×1
BINDER BREAST LRG (GAUZE/BANDAGES/DRESSINGS) ×3 IMPLANT
BLADE SURG 15 STRL LF DISP TIS (BLADE) ×1 IMPLANT
BLADE SURG 15 STRL SS (BLADE) ×3
CANISTER SUCTION 2500CC (MISCELLANEOUS) ×3 IMPLANT
CHLORAPREP W/TINT 26ML (MISCELLANEOUS) ×3 IMPLANT
CLIP APPLIE 9.375 MED OPEN (MISCELLANEOUS) ×1 IMPLANT
CONT SPEC 4OZ CLIKSEAL STRL BL (MISCELLANEOUS) ×6 IMPLANT
COVER PROBE W GEL 5X96 (DRAPES) ×3 IMPLANT
COVER SURGICAL LIGHT HANDLE (MISCELLANEOUS) ×3 IMPLANT
DERMABOND ADVANCED (GAUZE/BANDAGES/DRESSINGS) ×2
DERMABOND ADVANCED .7 DNX12 (GAUZE/BANDAGES/DRESSINGS) ×1 IMPLANT
DEVICE DUBIN SPECIMEN MAMMOGRA (MISCELLANEOUS) ×3 IMPLANT
DRAPE CHEST BREAST 15X10 FENES (DRAPES) ×3 IMPLANT
DRAPE PROXIMA HALF (DRAPES) ×3 IMPLANT
ELECT CAUTERY BLADE 6.4 (BLADE) ×3 IMPLANT
ELECT REM PT RETURN 9FT ADLT (ELECTROSURGICAL) ×3
ELECTRODE REM PT RTRN 9FT ADLT (ELECTROSURGICAL) ×1 IMPLANT
GAUZE SPONGE 4X4 12PLY STRL (GAUZE/BANDAGES/DRESSINGS) ×3 IMPLANT
GLOVE EUDERMIC 7 POWDERFREE (GLOVE) ×5 IMPLANT
GOWN STRL REUS W/ TWL LRG LVL3 (GOWN DISPOSABLE) ×2 IMPLANT
GOWN STRL REUS W/ TWL XL LVL3 (GOWN DISPOSABLE) ×1 IMPLANT
GOWN STRL REUS W/TWL LRG LVL3 (GOWN DISPOSABLE) ×6
GOWN STRL REUS W/TWL XL LVL3 (GOWN DISPOSABLE) ×3
ILLUMINATOR WAVEGUIDE N/F (MISCELLANEOUS) IMPLANT
KIT BASIN OR (CUSTOM PROCEDURE TRAY) ×3 IMPLANT
KIT MARKER MARGIN INK (KITS) ×3 IMPLANT
LIGHT WAVEGUIDE WIDE FLAT (MISCELLANEOUS) IMPLANT
NDL HYPO 25X1 1.5 SAFETY (NEEDLE) ×1 IMPLANT
NEEDLE FILTER BLUNT 18X 1/2SAF (NEEDLE) ×2
NEEDLE FILTER BLUNT 18X1 1/2 (NEEDLE) ×1 IMPLANT
NEEDLE HYPO 25X1 1.5 SAFETY (NEEDLE) ×3 IMPLANT
NS IRRIG 1000ML POUR BTL (IV SOLUTION) ×3 IMPLANT
PACK SURGICAL SETUP 50X90 (CUSTOM PROCEDURE TRAY) ×3 IMPLANT
PENCIL BUTTON HOLSTER BLD 10FT (ELECTRODE) ×3 IMPLANT
SPONGE LAP 18X18 X RAY DECT (DISPOSABLE) ×3 IMPLANT
SUT MNCRL AB 4-0 PS2 18 (SUTURE) ×5 IMPLANT
SUT SILK 2 0 SH (SUTURE) ×2 IMPLANT
SUT VIC AB 3-0 SH 18 (SUTURE) ×3 IMPLANT
SYR BULB 3OZ (MISCELLANEOUS) ×3 IMPLANT
SYR CONTROL 10ML LL (SYRINGE) ×3 IMPLANT
TOWEL OR 17X24 6PK STRL BLUE (TOWEL DISPOSABLE) ×3 IMPLANT
TOWEL OR 17X26 10 PK STRL BLUE (TOWEL DISPOSABLE) ×3 IMPLANT
TUBE CONNECTING 12'X1/4 (SUCTIONS) ×1
TUBE CONNECTING 12X1/4 (SUCTIONS) ×2 IMPLANT
YANKAUER SUCT BULB TIP NO VENT (SUCTIONS) ×3 IMPLANT

## 2016-05-26 NOTE — Op Note (Signed)
Patient Name:           Mercedes Nelson   Date of Surgery:        05/26/2016  Pre op Diagnosis:      Invasive cancer right breast, upper outer quadrant, receptor positive, HER-2 negative.  Clinical stage IA.  Post op Diagnosis:    Same  Procedure:                 Inject blue dye right breast,   right breast lumpectomy with radioactive seed localization,   right axillary sentinel lymph node biopsy   Surgeon:                     Edsel Petrin. Dalbert Batman, M.D., FACS  Assistant:                      OR staff  Operative Indications:   This is a 65 year old Caucasian female, referred to me by her PCP Dr. Barbette Hair and for evaluation of a new right breast cancer.      The patient used to live in [place] and lives here now. 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 She gets them annually. 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 Her insurance requires her to go to [facility] and that is what she wants to do because she lives in [place] now.     Past history is significant for hyperlipidemia. She's had a goiter all of her life and states that her thyroid function tests are normal and ultrasounds have showed no nodules in the past.      Family history reveals ovarian cancer in a maternal grandmother. Breast cancer in a great aunt. No pancreatic or prostate cancer..     We spent a long time talking about her cancer. I told her that clinically she was a stage IA cancer. The doesn't seem to be any indication for MRI. Her slides have been reviewed by Dr. Lyndon Code in pathology and he agrees with the original report. She has been seen by Dr. Lindi Adie from medical oncology preop     We had a long talk about management of breast cancer in  general and hers in detail. She we talked whether diverting lumpectomy and mastectomy. We talked about reconstruction. We talked about sentinel lymph node biopsy. She is in favor of lumpectomy and I think she is an excellent candidate for that. She will be tentatively scheduled for right breast lumpectomy with radioactive seed localization and right axillary sentinel lymph node biopsy.   Operative Findings:       The patient underwent injection of technetium 99 radionuclide in the holding area by the nuclear medicine technician.  Using the neoprobe I could hear the audible signal of the I-125 radioactive seed in the upper outer right breast.  The specimen mammogram looked very good with the radioactive seed in the marker clip very close to each other in the very center of the specimen.  I found 1 sentinel lymph node that was hot and blue and adjacent to this was a non-sentinel lymph node that was removed.  There were no other sentinel nodes.  Procedure in Detail:          Following  the induction of general endotracheal anesthesia a surgical timeout was performed.  Intravenous antibiotics were given.  Following alcohol prep I injected 5 mL of dilute methylene blue into the right breast, subareolar area, and massaged the breast for a few minutes.  We then prepped and draped the entire right breast chest wall and axilla.      Using the neoprobe I identified the area of radioactivity in the right breast.  Breast tissue is moderately large and it was midway between the areolar margin and the axilla.  I placed a curvilinear incision as lateral as possible in the upper outer right breast.   using the neoprobe I dissected down into the breast tissue and around the radioactive signal.  The specimen was removed and marked with silk sutures and a 6 color ink kit  to orient the pathologist.  The specimen mammogram looked good as mentioned above and the specimen was sent to the lab.     I then set the neoprobe to  technetium 99.  Incision was made in the right axilla at the hairline.  I dissected down through the clavipectoral fascia.  Using the neoprobe I found a single very hot and very blue lymph node which was removed as sentinel node #1.  There was a lymph node immediately adjacent to this that was enlarged  although it did not have any radioactivity so I sent that as a non-sentinel lymph node.  There was an arterial bleeder feeding these lymph nodes that was suture ligated with Vicryl sutures.    Both wounds were irrigated with saline.  Hemostasis was excellent and achieved with electrocautery.  I placed multiple medical clips in the lumpectomy cavity at 6 cardinal positions.  Both wounds were closed in layers with interrupted sutures of 3-0 Vicryl.  Both skin incisions were closed with running sutures subcuticular sutures of 4-0 Monocryl and Dermabond.  Dry bandages and a breast binder were placed.  The patient tolerated the procedure well was taken to PACU in stable condition.  EBL 20 mL.  Counts correct.  Complications none.      Edsel Petrin. Dalbert Batman, M.D., FACS General and Minimally Invasive Surgery Breast and Colorectal Surgery  05/26/2016 12:30 PM

## 2016-05-26 NOTE — Anesthesia Procedure Notes (Addendum)
Procedure Name: LMA Insertion Date/Time: 05/26/2016 11:16 AM Performed by: Layla Maw Pre-anesthesia Checklist: Patient identified, Patient being monitored, Timeout performed, Emergency Drugs available and Suction available Patient Re-evaluated:Patient Re-evaluated prior to inductionOxygen Delivery Method: Circle System Utilized Preoxygenation: Pre-oxygenation with 100% oxygen Intubation Type: IV induction Ventilation: Mask ventilation without difficulty LMA: LMA inserted LMA Size: 5.0 Number of attempts: 1 Placement Confirmation: positive ETCO2 and breath sounds checked- equal and bilateral Tube secured with: Tape Dental Injury: Teeth and Oropharynx as per pre-operative assessment  Comments: 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

## 2016-05-26 NOTE — Transfer of Care (Signed)
Immediate Anesthesia Transfer of Care Note  Patient: Mercedes Nelson  Procedure(s) Performed: Procedure(s): BREAST LUMPECTOMY WITH RADIOACTIVE SEED AND SENTINEL LYMPH NODE BIOPSY, INJECT BLUE DYE RIGHT BREAST (Right)  Patient Location: PACU  Anesthesia Type:General  Level of Consciousness: awake and alert   Airway & Oxygen Therapy: Patient Spontanous Breathing and Patient connected to nasal cannula oxygen  Post-op Assessment: Report given to RN, Post -op Vital signs reviewed and stable and Patient moving all extremities X 4  Post vital signs: Reviewed and stable  Last Vitals:  Filed Vitals:   05/26/16 1010 05/26/16 1238  BP: 142/59   Pulse: 51 68  Temp:  36.6 C  Resp: 14 14    Last Pain:  Filed Vitals:   05/26/16 1240  PainSc: 0-No pain      Patients Stated Pain Goal: 3 ([id number])  Complications: No apparent anesthesia complications

## 2016-05-26 NOTE — Discharge Instructions (Signed)
[facility] °Office Phone Number [phone] ° °BREAST BIOPSY/ PARTIAL MASTECTOMY: POST OP INSTRUCTIONS ° °Always review your discharge instruction sheet given to you by the facility where your surgery was performed. ° °IF YOU HAVE DISABILITY OR FAMILY LEAVE FORMS, YOU MUST BRING THEM TO THE OFFICE FOR PROCESSING.  DO NOT GIVE THEM TO YOUR DOCTOR. ° °1. A prescription for pain medication may be given to you upon discharge.  Take your pain medication as prescribed, if needed.  If narcotic pain medicine is not needed, then you may take acetaminophen (Tylenol) or ibuprofen (Advil) as needed. °2. Take your usually prescribed medications unless otherwise directed °3. If you need a refill on your pain medication, please contact your pharmacy.  They will contact our office to request authorization.  Prescriptions will not be filled after 5pm or on week-ends. °4. You should eat very light the first 24 hours after surgery, such as soup, crackers, pudding, etc.  Resume your normal diet the day after surgery. °5. Most patients will experience some swelling and bruising in the breast.  Ice packs and a good support bra will help.  Swelling and bruising can take several days to resolve.  °6. It is common to experience some constipation if taking pain medication after surgery.  Increasing fluid intake and taking a stool softener will usually help or prevent this problem from occurring.  A mild laxative (Milk of Magnesia or Miralax) should be taken according to package directions if there are no bowel movements after 48 hours. °7. Unless discharge instructions indicate otherwise, you may remove your bandages 24-48 hours after surgery, and you may shower at that time.  You may have steri-strips (small skin tapes) in place directly over the incision.  These strips should be left on the skin for 7-10 days.  If your surgeon used skin glue on the incision, you may shower in 24 hours.  The glue will flake off over the  next 2-3 weeks.  Any sutures or staples will be removed at the office during your follow-up visit. °8. ACTIVITIES:  You may resume regular daily activities (gradually increasing) beginning the next day.  Wearing a good support bra or sports bra minimizes pain and swelling.  You may have sexual intercourse when it is comfortable. °a. You may drive when you no longer are taking prescription pain medication, you can comfortably wear a seatbelt, and you can safely maneuver your car and apply brakes. °b. RETURN TO WORK:  ______________________________________________________________________________________ °9. You should see your doctor in the office for a follow-up appointment approximately two weeks after your surgery.  Your doctor’s nurse will typically make your follow-up appointment when she calls you with your pathology report.  Expect your pathology report 2-3 business days after your surgery.  You may call to check if you do not hear from us after three days. °10. OTHER INSTRUCTIONS: _______________________________________________________________________________________________ _____________________________________________________________________________________________________________________________________ °_____________________________________________________________________________________________________________________________________ °_____________________________________________________________________________________________________________________________________ ° °WHEN TO CALL YOUR DOCTOR: °1. Fever over 101.0 °2. Nausea and/or vomiting. °3. Extreme swelling or bruising. °4. Continued bleeding from incision. °5. Increased pain, redness, or drainage from the incision. ° °The clinic staff is available to answer your questions during regular business hours.  Please don’t hesitate to call and ask to speak to one of the nurses for clinical concerns.  If you have a medical emergency, go to the nearest  emergency room or call 911.  A surgeon from [facility] is always on call at the hospital. ° °For further questions, please visit [email or web address]  °

## 2016-05-26 NOTE — Interval H&P Note (Signed)
History and Physical Interval Note:  05/26/2016 10:21 AM  Mercedes Nelson  has presented today for surgery, with the diagnosis of RIGHT BREAST CANCER  The various methods of treatment have been discussed with the patient and family. After consideration of risks, benefits and other options for treatment, the patient has consented to  Procedure(s): BREAST LUMPECTOMY WITH RADIOACTIVE SEED AND SENTINEL LYMPH NODE BIOPSY, INJECT BLUE DYE RIGHT BREAST (Right) as a surgical intervention .  The patient's history has been reviewed, patient examined, no change in status, stable for surgery.  I have reviewed the patient's chart and labs.  Questions were answered to the patient's satisfaction.     Adin Hector

## 2016-05-26 NOTE — Anesthesia Postprocedure Evaluation (Signed)
Anesthesia Post Note  Patient: Mercedes Nelson  Procedure(s) Performed: Procedure(s) (LRB): BREAST LUMPECTOMY WITH RADIOACTIVE SEED AND SENTINEL LYMPH NODE BIOPSY, INJECT BLUE DYE RIGHT BREAST (Right)  Patient location during evaluation: PACU Anesthesia Type: General Level of consciousness: awake, awake and alert and oriented Pain management: pain level controlled Vital Signs Assessment: post-procedure vital signs reviewed and stable Respiratory status: spontaneous breathing and nonlabored ventilation Cardiovascular status: blood pressure returned to baseline Anesthetic complications: no    Last Vitals:  Filed Vitals:   05/26/16 1400 05/26/16 1407  BP:  138/69  Pulse: 58 60  Temp:    Resp:  15    Last Pain:  Filed Vitals:   05/26/16 1408  PainSc: 3                  Ronita Hargreaves COKER

## 2016-05-26 NOTE — Anesthesia Preprocedure Evaluation (Signed)
Anesthesia Evaluation  Patient identified by MRN, date of birth, ID band Patient awake    Reviewed: NPO status , Patient's Chart, lab work & pertinent test results  Airway Mallampati: II  TM Distance: >3 FB Neck ROM: Full    Dental  (+) Teeth Intact, Dental Advisory Given   Pulmonary former smoker,    breath sounds clear to auscultation       Cardiovascular  Rhythm:Regular Rate:Normal     Neuro/Psych    GI/Hepatic   Endo/Other    Renal/GU      Musculoskeletal   Abdominal   Peds  Hematology   Anesthesia Other Findings   Reproductive/Obstetrics                             Anesthesia Physical Anesthesia Plan  ASA: II  Anesthesia Plan: General   Post-op Pain Management:    Induction: Intravenous  Airway Management Planned: LMA  Additional Equipment:   Intra-op Plan:   Post-operative Plan:   Informed Consent: I have reviewed the patients History and Physical, chart, labs and discussed the procedure including the risks, benefits and alternatives for the proposed anesthesia with the patient or authorized representative who has indicated his/her understanding and acceptance.   Dental advisory given  Plan Discussed with: CRNA and Anesthesiologist  Anesthesia Plan Comments:         Anesthesia Quick Evaluation

## 2016-05-29 ENCOUNTER — Encounter (HOSPITAL_COMMUNITY): Payer: Self-pay | Admitting: General Surgery

## 2016-05-29 NOTE — Progress Notes (Signed)
Quick Note:  Inform patient of Pathology report,. Known tumor 1.6 cm with negative margins. Nlodes negative. Basically good news. No more surgery needed.  hmi ______

## 2016-06-05 ENCOUNTER — Telehealth: Payer: Self-pay | Admitting: *Deleted

## 2016-06-05 ENCOUNTER — Other Ambulatory Visit: Payer: Self-pay | Admitting: *Deleted

## 2016-06-05 ENCOUNTER — Encounter: Payer: Self-pay | Admitting: Hematology and Oncology

## 2016-06-05 ENCOUNTER — Ambulatory Visit (HOSPITAL_BASED_OUTPATIENT_CLINIC_OR_DEPARTMENT_OTHER): Payer: PPO | Admitting: Hematology and Oncology

## 2016-06-05 ENCOUNTER — Telehealth: Payer: Self-pay | Admitting: Hematology and Oncology

## 2016-06-05 VITALS — BP 127/62 | HR 58 | Temp 98.3°F | Resp 17 | Wt 171.3 lb

## 2016-06-05 DIAGNOSIS — C50411 Malignant neoplasm of upper-outer quadrant of right female breast: Secondary | ICD-10-CM

## 2016-06-05 DIAGNOSIS — Z170 Estrogen receptor positive status [ER+]: Secondary | ICD-10-CM

## 2016-06-05 NOTE — Telephone Encounter (Signed)
Spoke with patient about genetics appt date/time per 6/19 pof and she did not want to schedule genetics appt at this time

## 2016-06-05 NOTE — Progress Notes (Signed)
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[place])   04/05/2016 Mammogram Asymmetry in the right breast lateral middle depth, 11 mm non-circumscribed mass ([facility] mammogram)   04/05/2016 Initial Diagnosis Right breast biopsy: Invasive ductal carcinoma ER 90-100%, PR 20%, Ki-67 20%, HER-2 Neg, T1b N0 stage IA clinical stage   05/26/2016 Surgery Right lumpectomy: IDC with DCIS 1.6 and goes, margins, 0/2 lymph nodes negative, ER 100%, PR 20%, Ki-67 15%, HER-2 negative ratio 1.0 T1 BN 0 stage IA pathologic stage    CHIEF COMPLIANT: Follow-up after right lumpectomy  INTERVAL HISTORY: Mercedes Nelson is a 65 year old with above-mentioned history of right breast cancer treated with lumpectomy and is here today to discuss the pathology report. She appears to be healing fairly well. She does have an area of discomfort moving the arm.  REVIEW OF SYSTEMS:   Constitutional: Denies fevers, chills or abnormal weight loss Eyes: Denies blurriness of vision Ears, nose, mouth, throat, and face: Denies mucositis or sore throat Respiratory: Denies cough, dyspnea or wheezes Cardiovascular: Denies palpitation, chest discomfort Gastrointestinal:  Denies nausea, heartburn or change in bowel habits Skin: Denies abnormal skin rashes Lymphatics: Denies new lymphadenopathy or easy bruising Neurological:Denies numbness, tingling or new weaknesses Behavioral/Psych: Mood is stable, no new changes  Extremities: No lower extremity edema Breast: Recent right lumpectomy All other systems were reviewed with the patient and are negative.  I have reviewed the past medical history, past surgical history, social history and family history with the patient and they are unchanged from previous note.  ALLERGIES:  has No Known Allergies.  MEDICATIONS:  Current Outpatient Prescriptions  Medication Sig Dispense Refill    . HYDROcodone-acetaminophen (NORCO) 5-325 MG tablet Take 1-2 tablets by mouth every 6 (six) hours as needed for moderate pain or severe pain. 30 tablet 0  . Multiple Vitamin (MULTIVITAMIN) tablet Take 1 tablet by mouth daily.    . naproxen sodium (ANAPROX) 220 MG tablet Take 220 mg by mouth as needed (pain).     . simvastatin (ZOCOR) 40 MG tablet Take 40 mg by mouth daily at 6 PM.     No current facility-administered medications for this visit.    PHYSICAL EXAMINATION: ECOG PERFORMANCE STATUS: 1 - Symptomatic but completely ambulatory  Filed Vitals:   06/05/16 1139  BP: 127/62  Pulse: 58  Temp: 98.3 F (36.8 C)  Resp: 17   Filed Weights   06/05/16 1139  Weight: 171 lb 4.8 oz (77.701 kg)    GENERAL:alert, no distress and comfortable SKIN: skin color, texture, turgor are normal, no rashes or significant lesions EYES: normal, Conjunctiva are pink and non-injected, sclera clear OROPHARYNX:no exudate, no erythema and lips, buccal mucosa, and tongue normal  NECK: supple, thyroid normal size, non-tender, without nodularity LYMPH:  no palpable lymphadenopathy in the cervical, axillary or inguinal LUNGS: clear to auscultation and percussion with normal breathing effort HEART: regular rate & rhythm and no murmurs and no lower extremity edema ABDOMEN:abdomen soft, non-tender and normal bowel sounds MUSCULOSKELETAL:no cyanosis of digits and no clubbing  NEURO: alert & oriented x 3 with fluent speech, no focal motor/sensory deficits EXTREMITIES: No lower extremity edema  LABORATORY DATA:  I have reviewed the data as listed   Chemistry      Component Value Date/Time   NA 140 05/24/2016 1305   K 4.0 05/24/2016 1305   CL 106 05/24/2016 1305  CO2 27 05/24/2016 1305   BUN 15 05/24/2016 1305   CREATININE 0.79 05/24/2016 1305      Component Value Date/Time   CALCIUM 9.6 05/24/2016 1305   ALKPHOS 59 05/24/2016 1305   AST 22 05/24/2016 1305   ALT 24 05/24/2016 1305   BILITOT 1.2  05/24/2016 1305       Lab Results  Component Value Date   WBC 8.7 05/24/2016   HGB 13.8 05/24/2016   HCT 42.8 05/24/2016   MCV 89.9 05/24/2016   PLT 214 05/24/2016   NEUTROABS 6.8 05/24/2016     ASSESSMENT & PLAN:  Malignant neoplasm of upper-outer quadrant of right female breast ([place]) Right lumpectomy 05/26/2016: IDC with DCIS 1.6 and goes, margins, 0/2 lymph nodes negative, ER 100%, PR 20%, Ki-67 15%, HER-2 negative ratio 1.0 T1 BN 0 stage IA pathologic stage  Pathology counseling: I discussed the final pathology report of the patient provided  a copy of this report. I discussed the margins as well as lymph node surgeries. We also discussed the final staging along with previously performed ER/PR and HER-2/neu testing.  Recommendations: Genetics consultation (maternal grandmother ovarian cancer) 1. Oncotype DX testing to determine if chemotherapy would be of any benefit followed by 2. Adjuvant radiation therapy followed by 3. Adjuvant antiestrogen therapy  Return to clinic based upon Oncotype DX test result. If she is low risk we will see her back after radiation is complete.    No orders of the defined types were placed in this encounter.   The patient has a good understanding of the overall plan. she agrees with it. she will call with any problems that may develop before the next visit here.   Rulon Eisenmenger, MD 06/05/2016

## 2016-06-05 NOTE — Assessment & Plan Note (Signed)
Right lumpectomy 05/26/2016: IDC with DCIS 1.6 and goes, margins, 0/2 lymph nodes negative, ER 100%, PR 20%, Ki-67 15%, HER-2 negative ratio 1.0 T1 BN 0 stage IA pathologic stage  Pathology counseling: I discussed the final pathology report of the patient provided  a copy of this report. I discussed the margins as well as lymph node surgeries. We also discussed the final staging along with previously performed ER/PR and HER-2/neu testing.  Recommendations: Genetics consultation (maternal grandmother ovarian cancer) 1. Oncotype DX testing to determine if chemotherapy would be of any benefit followed by 2. Adjuvant radiation therapy followed by 3. Adjuvant antiestrogen therapy  Return to clinic based upon Oncotype DX test result. If she is low risk we will see her back after radiation is complete.

## 2016-06-05 NOTE — Telephone Encounter (Signed)
Ordered Oncotype Dx test per MD.  Faxed order to [facility] and called [facility] to verify she received it.  Faxed order to [facility].  Placed a note to look for results.

## 2016-06-08 ENCOUNTER — Encounter (HOSPITAL_COMMUNITY): Payer: Self-pay

## 2016-06-12 DIAGNOSIS — C50411 Malignant neoplasm of upper-outer quadrant of right female breast: Secondary | ICD-10-CM | POA: Diagnosis not present

## 2016-06-13 ENCOUNTER — Telehealth: Payer: Self-pay | Admitting: *Deleted

## 2016-06-13 NOTE — Telephone Encounter (Signed)
Confirmed appointment with Dr. Lindi Adie for 06/14/16 at 930am to discuss oncotype results.

## 2016-06-13 NOTE — Telephone Encounter (Signed)
Received Oncotype Dx result of 26/17%.  Placed a copy in Dr. Geralyn Flash box, gave a copy to [facility] & took a copy to HIM to scan.

## 2016-06-14 ENCOUNTER — Telehealth: Payer: Self-pay | Admitting: Hematology and Oncology

## 2016-06-14 ENCOUNTER — Encounter: Payer: Self-pay | Admitting: Hematology and Oncology

## 2016-06-14 ENCOUNTER — Ambulatory Visit (HOSPITAL_BASED_OUTPATIENT_CLINIC_OR_DEPARTMENT_OTHER): Payer: PPO | Admitting: Hematology and Oncology

## 2016-06-14 ENCOUNTER — Other Ambulatory Visit: Payer: Self-pay | Admitting: General Surgery

## 2016-06-14 VITALS — BP 140/54 | HR 54 | Temp 98.6°F | Resp 18 | Ht 67.0 in | Wt 175.1 lb

## 2016-06-14 DIAGNOSIS — Z170 Estrogen receptor positive status [ER+]: Secondary | ICD-10-CM

## 2016-06-14 DIAGNOSIS — C50411 Malignant neoplasm of upper-outer quadrant of right female breast: Secondary | ICD-10-CM

## 2016-06-14 NOTE — Progress Notes (Signed)
Patient Care Team: Pcp Not In System as PCP - General  DIAGNOSIS: No matching staging information was found for the patient.  SUMMARY OF ONCOLOGIC HISTORY:   Malignant neoplasm of upper-outer quadrant of right female breast (Mercedes Nelson)   04/05/2016 Mammogram Asymmetry in the right breast lateral middle depth, 11 mm non-circumscribed mass ([facility] mammogram)   04/05/2016 Initial Diagnosis Right breast biopsy: Invasive ductal carcinoma ER 90-100%, PR 20%, Ki-67 20%, HER-2 Neg, T1b N0 stage IA clinical stage   05/26/2016 Surgery Right lumpectomy: IDC with DCIS 1.6 and goes, margins, 0/2 lymph nodes negative, ER 100%, PR 20%, Ki-67 15%, HER-2 negative ratio 1.0 T1 BN 0 stage IA pathologic stage, Oncotype DX 26, 17% ROR    CHIEF COMPLIANT: Follow-up to discuss Oncotype score  INTERVAL HISTORY: Mercedes Nelson is a 65 year old with above-mentioned history of right breast cancer treated with lumpectomy and was found to have intermediate risk Oncotype DX score 26 which translates into a 17% risk of recurrence. She is here to discuss a score and to discuss the role of chemotherapy. She is healed very well from surgery standpoint. She has no new symptoms or concerns.  REVIEW OF SYSTEMS:   Constitutional: Denies fevers, chills or abnormal weight loss Eyes: Denies blurriness of vision Ears, nose, mouth, throat, and face: Denies mucositis or sore throat Respiratory: Denies cough, dyspnea or wheezes Cardiovascular: Denies palpitation, chest discomfort Gastrointestinal:  Denies nausea, heartburn or change in bowel habits Skin: Denies abnormal skin rashes Lymphatics: Denies new lymphadenopathy or easy bruising Neurological:Denies numbness, tingling or new weaknesses Behavioral/Psych: Mood is stable, no new changes  Extremities: No lower extremity edema Breast:  denies any pain or lumps or nodules in either breasts All other systems were reviewed with the patient and are negative.  I have reviewed the past  medical history, past surgical history, social history and family history with the patient and they are unchanged from previous note.  ALLERGIES:  has No Known Allergies.  MEDICATIONS:  Current Outpatient Prescriptions  Medication Sig Dispense Refill  . HYDROcodone-acetaminophen (NORCO) 5-325 MG tablet Take 1-2 tablets by mouth every 6 (six) hours as needed for moderate pain or severe pain. 30 tablet 0  . Multiple Vitamin (MULTIVITAMIN) tablet Take 1 tablet by mouth daily.    . naproxen sodium (ANAPROX) 220 MG tablet Take 220 mg by mouth as needed (pain).     . simvastatin (ZOCOR) 40 MG tablet Take 40 mg by mouth daily at 6 PM.     No current facility-administered medications for this visit.    PHYSICAL EXAMINATION: ECOG PERFORMANCE STATUS: 0 - Asymptomatic  Filed Vitals:   06/14/16 0956  BP: 140/54  Pulse: 54  Temp: 98.6 F (37 C)  Resp: 18   Filed Weights   06/14/16 0956  Weight: 175 lb 1.6 oz (79.425 kg)    GENERAL:alert, no distress and comfortable SKIN: skin color, texture, turgor are normal, no rashes or significant lesions EYES: normal, Conjunctiva are pink and non-injected, sclera clear OROPHARYNX:no exudate, no erythema and lips, buccal mucosa, and tongue normal  NECK: supple, thyroid normal size, non-tender, without nodularity LYMPH:  no palpable lymphadenopathy in the cervical, axillary or inguinal LUNGS: clear to auscultation and percussion with normal breathing effort HEART: regular rate & rhythm and no murmurs and no lower extremity edema ABDOMEN:abdomen soft, non-tender and normal bowel sounds MUSCULOSKELETAL:no cyanosis of digits and no clubbing  NEURO: alert & oriented x 3 with fluent speech, no focal motor/sensory deficits EXTREMITIES: No lower extremity  edema  LABORATORY DATA:  I have reviewed the data as listed   Chemistry      Component Value Date/Time   NA 140 05/24/2016 1305   K 4.0 05/24/2016 1305   CL 106 05/24/2016 1305   CO2 27 05/24/2016  1305   BUN 15 05/24/2016 1305   CREATININE 0.79 05/24/2016 1305      Component Value Date/Time   CALCIUM 9.6 05/24/2016 1305   ALKPHOS 59 05/24/2016 1305   AST 22 05/24/2016 1305   ALT 24 05/24/2016 1305   BILITOT 1.2 05/24/2016 1305       Lab Results  Component Value Date   WBC 8.7 05/24/2016   HGB 13.8 05/24/2016   HCT 42.8 05/24/2016   MCV 89.9 05/24/2016   PLT 214 05/24/2016   NEUTROABS 6.8 05/24/2016     ASSESSMENT & PLAN:  Malignant neoplasm of upper-outer quadrant of right female breast ([name]) Right lumpectomy 05/26/2016: IDC with DCIS 1.6 cm, margins, 0/2 lymph nodes negative, ER 100%, PR 20%, Ki-67 15%, HER-2 negative ratio 1.0 T1 BN 0 stage IA pathologic stage Oncotype DX 26, 17% risk of recurrence  Recommendation: 1. Adjuvant chemotherapy with Taxotere and Cytoxan followed by 2. Adjuvant radiation therapy followed by 3. Adjuvant antiestrogen therapy  Chemotherapy Counseling: I discussed the risks and benefits of chemotherapy including the risks of nausea/ vomiting, risk of infection from low WBC count, fatigue due to chemo or anemia, bruising or bleeding due to low platelets, mouth sores, loss/ change in taste and decreased appetite. Liver and kidney function will be monitored through out chemotherapy as abnormalities in liver and kidney function may be a side effect of treatment. Risk of permanent bone marrow dysfunction and leukemia due to chemo were also discussed.  I will request Dr. Dalbert Batman for a port placement  Return to clinic in 2 weeks to start chemotherapy on 06/27/16.  No orders of the defined types were placed in this encounter.   The patient has a good understanding of the overall plan. she agrees with it. she will call with any problems that may develop before the next visit here.   Rulon Eisenmenger, MD 06/14/2016

## 2016-06-14 NOTE — Assessment & Plan Note (Signed)
Right lumpectomy 05/26/2016: IDC with DCIS 1.6 cm, margins, 0/2 lymph nodes negative, ER 100%, PR 20%, Ki-67 15%, HER-2 negative ratio 1.0 T1 BN 0 stage IA pathologic stage Oncotype DX 26, 17% risk of recurrence  Recommendation: 1. Adjuvant chemotherapy with Taxotere and Cytoxan followed by 2. Adjuvant radiation therapy followed by 3. Adjuvant antiestrogen therapy  Chemotherapy Counseling: I discussed the risks and benefits of chemotherapy including the risks of nausea/ vomiting, risk of infection from low WBC count, fatigue due to chemo or anemia, bruising or bleeding due to low platelets, mouth sores, loss/ change in taste and decreased appetite. Liver and kidney function will be monitored through out chemotherapy as abnormalities in liver and kidney function may be a side effect of treatment. Risk of permanent bone marrow dysfunction and leukemia due to chemo were also discussed.  Return to clinic in 2 weeks to start chemotherapy.

## 2016-06-14 NOTE — Telephone Encounter (Signed)
appt made and avs printed °

## 2016-06-15 ENCOUNTER — Other Ambulatory Visit: Payer: PPO

## 2016-06-15 ENCOUNTER — Encounter: Payer: PPO | Admitting: Genetic Counselor

## 2016-06-21 ENCOUNTER — Other Ambulatory Visit: Payer: Self-pay

## 2016-06-21 ENCOUNTER — Other Ambulatory Visit: Payer: PPO

## 2016-06-22 ENCOUNTER — Telehealth: Payer: Self-pay | Admitting: *Deleted

## 2016-06-22 ENCOUNTER — Other Ambulatory Visit: Payer: Self-pay | Admitting: Hematology and Oncology

## 2016-06-22 ENCOUNTER — Telehealth: Payer: Self-pay

## 2016-06-22 DIAGNOSIS — C50411 Malignant neoplasm of upper-outer quadrant of right female breast: Secondary | ICD-10-CM

## 2016-06-22 MED ORDER — DEXAMETHASONE 4 MG PO TABS: 4.0000 mg | ORAL_TABLET | Freq: Every day | ORAL | Status: DC

## 2016-06-22 MED ORDER — ONDANSETRON HCL 8 MG PO TABS: 8.0000 mg | ORAL_TABLET | Freq: Two times a day (BID) | ORAL | Status: DC | PRN

## 2016-06-22 MED ORDER — PROCHLORPERAZINE MALEATE 10 MG PO TABS: 10.0000 mg | ORAL_TABLET | Freq: Four times a day (QID) | ORAL | Status: DC | PRN

## 2016-06-22 MED ORDER — LIDOCAINE-PRILOCAINE 2.5-2.5 % EX CREA: TOPICAL_CREAM | CUTANEOUS | Status: DC

## 2016-06-22 NOTE — Telephone Encounter (Signed)
Received TC that pt called inquiring about prescriptions needed prior to start of chemotherapy on 06/27/16.  Spoke with Dr. Lindi Adie and orders entered for emla cream/nausea medications/steroid.  Called to discuss these prescriptions with patient and to inform her these were ready for pick up.  I reviewed each of these with patient as well as taking claritin in conjunction to Onpro injections.  We also discussed how to properly apply emla cream once PAC inserted on treatment day.  Reviewed appointments scheduled for 06/27/16.  Pt without further questions at time of call and knows to notify us should any additional questions or concerns arise.

## 2016-06-22 NOTE — Telephone Encounter (Signed)
"  I am to begin my first chemotherapy next Tuesday and do not have any of the medications Dr. Lindi Adie mentioned.  Could he or his nurse call me when these are called in to [facility] on [address]  I believe I am to have steroids, emla cream and something that starts with an 'N'."

## 2016-06-23 ENCOUNTER — Encounter: Payer: Self-pay | Admitting: Hematology and Oncology

## 2016-06-23 ENCOUNTER — Encounter (HOSPITAL_COMMUNITY)
Admission: RE | Admit: 2016-06-23 | Discharge: 2016-06-23 | Disposition: A | Discharge status: Home / Self Care | Payer: PPO | Source: Ambulatory Visit | Attending: General Surgery | Admitting: General Surgery

## 2016-06-23 ENCOUNTER — Encounter (HOSPITAL_COMMUNITY): Payer: Self-pay

## 2016-06-23 DIAGNOSIS — Z170 Estrogen receptor positive status [ER+]: Secondary | ICD-10-CM | POA: Diagnosis not present

## 2016-06-23 DIAGNOSIS — C50411 Malignant neoplasm of upper-outer quadrant of right female breast: Secondary | ICD-10-CM | POA: Diagnosis not present

## 2016-06-23 DIAGNOSIS — E785 Hyperlipidemia, unspecified: Secondary | ICD-10-CM | POA: Diagnosis not present

## 2016-06-23 DIAGNOSIS — Z9011 Acquired absence of right breast and nipple: Secondary | ICD-10-CM | POA: Diagnosis not present

## 2016-06-23 DIAGNOSIS — Z79899 Other long term (current) drug therapy: Secondary | ICD-10-CM | POA: Diagnosis not present

## 2016-06-23 DIAGNOSIS — Z87891 Personal history of nicotine dependence: Secondary | ICD-10-CM | POA: Diagnosis not present

## 2016-06-23 HISTORY — DX: Personal history of urinary (tract) infections: Z87.440

## 2016-06-23 LAB — BASIC METABOLIC PANEL
Anion gap: 4 — ABNORMAL LOW (ref 5–15)
BUN: 17 mg/dL (ref 6–20)
CO2: 30 mmol/L (ref 22–32)
Calcium: 8.9 mg/dL (ref 8.9–10.3)
Chloride: 106 mmol/L (ref 101–111)
Creatinine, Ser: 0.72 mg/dL (ref 0.44–1.00)
GFR calc Af Amer: >60 mL/min (ref >60)
GLUCOSE: 90 mg/dL (ref 65–99)
POTASSIUM: 4.6 mmol/L (ref 3.5–5.1)
Sodium: 140 mmol/L (ref 135–145)

## 2016-06-23 LAB — CBC
HCT: 41.2 % (ref 36.0–46.0)
Hemoglobin: 13.4 g/dL (ref 12.0–15.0)
MCH: 30.1 pg (ref 26.0–34.0)
MCHC: 32.5 g/dL (ref 30.0–36.0)
MCV: 92.6 fL (ref 78.0–100.0)
Platelets: 229 K/uL (ref 150–400)
RBC: 4.45 MIL/uL (ref 3.87–5.11)
RDW: 13 % (ref 11.5–15.5)
WBC: 7.8 K/uL (ref 4.0–10.5)

## 2016-06-23 NOTE — Progress Notes (Signed)
Spoke w/ pt about copay assistance.  Pt is receiving severance pay & pension which will put her over the income requirements for the Patient [place].  Since PAF has a 6 month look back period we are going to wait until Oct. 2017 when her severance pay will end to apply for copay assistance & the [employer].  Pt is in agreement with this plan.  I will meet her on 06/27/16 to give her my card to contact me if anything changes.

## 2016-06-23 NOTE — Patient Instructions (Signed)
Mercedes Nelson  06/23/2016   Your procedure is scheduled on: Monday June 26, 2016  Report to [facility]  Entrance take [place]  elevators to 3rd floor to  [place] at 5:30 AM.  Call this number if you have problems the morning of surgery [phone]   Remember: ONLY 1 PERSON MAY GO WITH YOU TO SHORT STAY TO GET  READY MORNING OF [place].  Do not eat food or drink liquids :After Midnight.     Take these medicines the morning of surgery with A SIP OF WATER: NONE                               You may not have any metal on your body including hair pins and              piercings  Do not wear jewelry, make-up, lotions, powders or perfumes, deodorant             Do not wear nail polish.  Do not shave  48 hours prior to surgery.                Do not bring valuables to the hospital. [place].  Contacts, dentures or bridgework may not be worn into surgery.      Patients discharged the day of surgery will not be allowed to drive home.  Name and phone number of your driver:Carol Dwaine Gale (cousin)   _____________________________________________________________________             [facility] - Preparing for Surgery Before surgery, you can play an important role.  Because skin is not sterile, your skin needs to be as free of germs as possible.  You can reduce the number of germs on your skin by washing with CHG (chlorahexidine gluconate) soap before surgery.  CHG is an antiseptic cleaner which kills germs and bonds with the skin to continue killing germs even after washing. Please DO NOT use if you have an allergy to CHG or antibacterial soaps.  If your skin becomes reddened/irritated stop using the CHG and inform your nurse when you arrive at Short Stay. Do not shave (including legs and underarms) for at least 48 hours prior to the first CHG shower.  You may shave your face/neck. Please follow these  instructions carefully:  1.  Shower with CHG Soap the night before surgery and the  morning of Surgery.  2.  If you choose to wash your hair, wash your hair first as usual with your  normal  shampoo.  3.  After you shampoo, rinse your hair and body thoroughly to remove the  shampoo.                           4.  Use CHG as you would any other liquid soap.  You can apply chg directly  to the skin and wash                       Gently with a scrungie or clean washcloth.  5.  Apply the CHG Soap to your body ONLY FROM THE NECK DOWN.   Do not use on face/ open  Wound or open sores. Avoid contact with eyes, ears mouth and genitals (private parts).                       Wash face,  Genitals (private parts) with your normal soap.             6.  Wash thoroughly, paying special attention to the area where your surgery  will be performed.  7.  Thoroughly rinse your body with warm water from the neck down.  8.  DO NOT shower/wash with your normal soap after using and rinsing off  the CHG Soap.                9.  Pat yourself dry with a clean towel.            10.  Wear clean pajamas.            11.  Place clean sheets on your bed the night of your first shower and do not  sleep with pets. Day of Surgery : Do not apply any lotions/deodorants the morning of surgery.  Please wear clean clothes to the hospital/surgery center.  FAILURE TO FOLLOW THESE INSTRUCTIONS MAY RESULT IN THE CANCELLATION OF YOUR SURGERY PATIENT SIGNATURE_________________________________  NURSE SIGNATURE__________________________________  ________________________________________________________________________

## 2016-06-23 NOTE — Progress Notes (Signed)
Received VM from pt stating she picked up prescriptions needed prior to start of chemo as instructed; however, Zofran was denied by her insurance.  Pt inquiring on what she should do moving forward as she states she is supposed to take Zofran on Monday.  I sent information regarding this situation to Raquel in managed care to see what else could or needed to be done for approval of drug.  I called pt back to discuss; however, was unable to reach her and thus left VM.  On VM I informed pt Zofran was not a medication she had to take the day before chemo. I explained it is actually an antiemetic that she would not start until day 3 after chemo d/t premeds she will receive on day of chemo.  I also explained that pt had compazine prescribed and this was an antiemetic she could take as soon as the day of chemo.  Instructed her that in the mean time we would work on getting Zofran approved or obtaining an alternate plan should we need to.  Pt instructed to call back if she had any further questions or concerns.

## 2016-06-25 NOTE — H&P (Signed)
  Mercedes Nelson. Hall Busing Location: [facility] Patient #: [id number] DOB: 12/20/1950 Single / Language: Undefined / Race: White Female      History of Present Illness The patient is a 65 year old female who presents with breast cancer. This is a 65 year old female who returns for her first postop visit following definitive surgery for her right breast cancer.  On May 26, 2006 she underwent right partial mastectomy and sentinel node biopsy. She has done well. No problems with shoulder or wound but notices a little bit of burning under her upper arm. I explained this may be inflammation around the nerve. It is not numb.  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 ER 90%. PR 20%. HER-2 negative. Ki-67 15%.  Stage TIc, N0. She has seen Dr. Lindi Adie who recommend chemotherapy due to oncotype score 26/17% risk.   She will see me in 6 weeks for a wound check. I told her  I would put a port in and we discussed that for a  bit.   Allergies  No Known Drug Allergies05/24/2017  Medication History  Simvastatin ('40MG'$  Tablet, Oral) Active. Medications Reconciled  Vitals  Weight: 172.8 lb Height: 67in Body Surface Area: 1.9 m Body Mass Index: 27.06 kg/m  Temp.: 97.61F(Temporal)  Pulse: 67 (Regular)  BP: 132/74 (Sitting, Left Arm, Standard)       Physical Exam  General Note: Very pleasant. Cooperative. No distress.  Lungs: clear bilaterally  CV:  RRR, no murmer or ectopy. Good peripheral pulses   Breast Note: Lumpectomy incision upper outer quadrant and axillary incision are healing normally. Tissues are soft. Minimal thickening. No seroma or hematoma. Range of motion right shoulder is 100%. No sensory deficit under arm but she does report some burning somewhat neuropathic-type discomfort.     Assessment & Plan  BREAST CANCER OF UPPER-OUTER QUADRANT OF RIGHT FEMALE BREAST  (C50.411) Current Plans Follow up with [place] in the office in 6 WEEKS.  Call us sooner as needed.  You are recovering from your right breast lumpectomy and sentinel node biopsy without any obvious complications. The burning sensation under her arm is probably some inflammation from sensory nerves that go through the axilla. Hopefully this will improve You may return to the gym and exercise I recommend daily exercise, even if it is just walking around the neighborhood. Avoid contact sports for another 2 weeks  Oncotype score 26/17% risk recurrence with TAM alone. Agrees to chemotherapy For port placement   Return to see Dr. Dalbert Batman in 6 weeks.  GOITER (E04.9) HYPERLIPIDEMIA, MILD (E78.5)    Signed by Adin Hector, MD (06/12/2016 8:37 AM)

## 2016-06-26 ENCOUNTER — Ambulatory Visit (HOSPITAL_COMMUNITY): Payer: PPO | Admitting: Certified Registered Nurse Anesthetist

## 2016-06-26 ENCOUNTER — Ambulatory Visit (HOSPITAL_COMMUNITY): Payer: PPO

## 2016-06-26 ENCOUNTER — Ambulatory Visit (HOSPITAL_COMMUNITY)
Admission: RE | Admit: 2016-06-26 | Discharge: 2016-06-26 | Disposition: A | Discharge status: Home / Self Care | Payer: PPO | Source: Ambulatory Visit | Attending: General Surgery | Admitting: General Surgery

## 2016-06-26 ENCOUNTER — Encounter: Payer: Self-pay | Admitting: Hematology and Oncology

## 2016-06-26 ENCOUNTER — Encounter (HOSPITAL_COMMUNITY): Payer: Self-pay

## 2016-06-26 ENCOUNTER — Encounter (HOSPITAL_COMMUNITY)
Admission: RE | Disposition: A | Discharge status: Home / Self Care | Payer: Self-pay | Source: Ambulatory Visit | Attending: General Surgery

## 2016-06-26 DIAGNOSIS — E785 Hyperlipidemia, unspecified: Secondary | ICD-10-CM | POA: Insufficient documentation

## 2016-06-26 DIAGNOSIS — R918 Other nonspecific abnormal finding of lung field: Secondary | ICD-10-CM | POA: Diagnosis not present

## 2016-06-26 DIAGNOSIS — Z170 Estrogen receptor positive status [ER+]: Secondary | ICD-10-CM | POA: Diagnosis not present

## 2016-06-26 DIAGNOSIS — C50411 Malignant neoplasm of upper-outer quadrant of right female breast: Secondary | ICD-10-CM | POA: Diagnosis present

## 2016-06-26 DIAGNOSIS — Z87891 Personal history of nicotine dependence: Secondary | ICD-10-CM | POA: Insufficient documentation

## 2016-06-26 DIAGNOSIS — Z95828 Presence of other vascular implants and grafts: Secondary | ICD-10-CM

## 2016-06-26 DIAGNOSIS — C50919 Malignant neoplasm of unspecified site of unspecified female breast: Secondary | ICD-10-CM | POA: Diagnosis not present

## 2016-06-26 DIAGNOSIS — Z79899 Other long term (current) drug therapy: Secondary | ICD-10-CM | POA: Insufficient documentation

## 2016-06-26 DIAGNOSIS — Z9011 Acquired absence of right breast and nipple: Secondary | ICD-10-CM | POA: Diagnosis not present

## 2016-06-26 HISTORY — PX: PORTACATH PLACEMENT: SHX2246

## 2016-06-26 SURGERY — INSERTION, TUNNELED CENTRAL VENOUS DEVICE, WITH PORT
Anesthesia: General | Laterality: Left

## 2016-06-26 MED ORDER — CEFAZOLIN SODIUM-DEXTROSE 2-4 GM/100ML-% IV SOLN
2.0000 g | INTRAVENOUS | Status: AC
  Administered 2016-06-26: 2 g via INTRAVENOUS

## 2016-06-26 MED ORDER — FENTANYL CITRATE (PF) 100 MCG/2ML IJ SOLN
INTRAMUSCULAR | Status: AC
  Filled 2016-06-26: qty 2

## 2016-06-26 MED ORDER — BUPIVACAINE-EPINEPHRINE (PF) 0.5% -1:200000 IJ SOLN
INTRAMUSCULAR | Status: AC
  Filled 2016-06-26: qty 30

## 2016-06-26 MED ORDER — MEPERIDINE HCL 50 MG/ML IJ SOLN
INTRAMUSCULAR | Status: AC
  Filled 2016-06-26: qty 1

## 2016-06-26 MED ORDER — BUPIVACAINE-EPINEPHRINE (PF) 0.5% -1:200000 IJ SOLN
INTRAMUSCULAR | Status: DC | PRN
  Administered 2016-06-26: 15 mL

## 2016-06-26 MED ORDER — DEXAMETHASONE SODIUM PHOSPHATE 10 MG/ML IJ SOLN
INTRAMUSCULAR | Status: AC
  Filled 2016-06-26: qty 1

## 2016-06-26 MED ORDER — DEXAMETHASONE SODIUM PHOSPHATE 10 MG/ML IJ SOLN
INTRAMUSCULAR | Status: DC | PRN
Start: 2016-06-26 — End: 2016-06-26
  Administered 2016-06-26: 10 mg via INTRAVENOUS

## 2016-06-26 MED ORDER — SCOPOLAMINE 1 MG/3DAYS TD PT72
1.0000 | MEDICATED_PATCH | TRANSDERMAL | Status: DC
  Administered 2016-06-26: 1 via TRANSDERMAL
  Filled 2016-06-26: qty 1

## 2016-06-26 MED ORDER — MIDAZOLAM HCL 2 MG/2ML IJ SOLN
INTRAMUSCULAR | Status: AC
  Filled 2016-06-26: qty 2

## 2016-06-26 MED ORDER — ACETAMINOPHEN 325 MG PO TABS: 650.0000 mg | ORAL_TABLET | ORAL | Status: DC | PRN

## 2016-06-26 MED ORDER — FENTANYL CITRATE (PF) 100 MCG/2ML IJ SOLN: 25.0000 ug | INTRAMUSCULAR | Status: DC | PRN

## 2016-06-26 MED ORDER — FENTANYL CITRATE (PF) 100 MCG/2ML IJ SOLN
INTRAMUSCULAR | Status: DC | PRN
  Administered 2016-06-26 (×2): 25 ug via INTRAVENOUS

## 2016-06-26 MED ORDER — LIDOCAINE HCL (CARDIAC) 20 MG/ML IV SOLN
INTRAVENOUS | Status: AC
  Filled 2016-06-26: qty 5

## 2016-06-26 MED ORDER — SODIUM CHLORIDE 0.9 % IV SOLN
Freq: Once | INTRAVENOUS | Status: AC
  Administered 2016-06-26: 500 mL
  Filled 2016-06-26: qty 1.2

## 2016-06-26 MED ORDER — SODIUM CHLORIDE 0.9% FLUSH: 3.0000 mL | INTRAVENOUS | Status: DC | PRN

## 2016-06-26 MED ORDER — PROPOFOL 10 MG/ML IV BOLUS
INTRAVENOUS | Status: AC
  Filled 2016-06-26: qty 20

## 2016-06-26 MED ORDER — SODIUM CHLORIDE 0.9 % IV SOLN: INTRAVENOUS | Status: DC

## 2016-06-26 MED ORDER — ONDANSETRON HCL 4 MG/2ML IJ SOLN
INTRAMUSCULAR | Status: AC
  Filled 2016-06-26: qty 2

## 2016-06-26 MED ORDER — PROPOFOL 10 MG/ML IV BOLUS
INTRAVENOUS | Status: DC | PRN
Start: 2016-06-26 — End: 2016-06-26
  Administered 2016-06-26: 150 mg via INTRAVENOUS

## 2016-06-26 MED ORDER — SODIUM CHLORIDE 0.9% FLUSH: 3.0000 mL | Freq: Two times a day (BID) | INTRAVENOUS | Status: DC

## 2016-06-26 MED ORDER — ONDANSETRON HCL 4 MG/2ML IJ SOLN
INTRAMUSCULAR | Status: DC | PRN
  Administered 2016-06-26 (×2): 4 mg via INTRAVENOUS

## 2016-06-26 MED ORDER — OXYCODONE HCL 5 MG PO TABS: 5.0000 mg | ORAL_TABLET | ORAL | Status: DC | PRN

## 2016-06-26 MED ORDER — CEFAZOLIN SODIUM-DEXTROSE 2-4 GM/100ML-% IV SOLN
INTRAVENOUS | Status: AC
  Filled 2016-06-26: qty 100

## 2016-06-26 MED ORDER — CHLORHEXIDINE GLUCONATE CLOTH 2 % EX PADS: 6.0000 | MEDICATED_PAD | Freq: Once | CUTANEOUS | Status: DC

## 2016-06-26 MED ORDER — LACTATED RINGERS IV SOLN
INTRAVENOUS | Status: DC
  Administered 2016-06-26 (×2): via INTRAVENOUS

## 2016-06-26 MED ORDER — ACETAMINOPHEN 650 MG RE SUPP
650.0000 mg | RECTAL | Status: DC | PRN
  Filled 2016-06-26: qty 1

## 2016-06-26 MED ORDER — PROMETHAZINE HCL 25 MG/ML IJ SOLN: 6.2500 mg | INTRAMUSCULAR | Status: DC | PRN

## 2016-06-26 MED ORDER — HEPARIN SOD (PORK) LOCK FLUSH 100 UNIT/ML IV SOLN
INTRAVENOUS | Status: AC
  Filled 2016-06-26: qty 10

## 2016-06-26 MED ORDER — ACETAMINOPHEN 500 MG PO TABS
1000.0000 mg | ORAL_TABLET | ORAL | Status: AC
  Administered 2016-06-26: 1000 mg via ORAL
  Filled 2016-06-26: qty 2

## 2016-06-26 MED ORDER — HEPARIN SOD (PORK) LOCK FLUSH 100 UNIT/ML IV SOLN
INTRAVENOUS | Status: DC | PRN
  Administered 2016-06-26: 500 [IU]

## 2016-06-26 MED ORDER — EPHEDRINE SULFATE 50 MG/ML IJ SOLN
INTRAMUSCULAR | Status: DC | PRN
  Administered 2016-06-26: 10 mg via INTRAVENOUS
  Administered 2016-06-26 (×2): 5 mg via INTRAVENOUS
  Administered 2016-06-26: 10 mg via INTRAVENOUS

## 2016-06-26 MED ORDER — HYDROCODONE-ACETAMINOPHEN 5-325 MG PO TABS: 1.0000 | ORAL_TABLET | Freq: Four times a day (QID) | ORAL | Status: DC | PRN

## 2016-06-26 MED ORDER — MIDAZOLAM HCL 5 MG/5ML IJ SOLN
INTRAMUSCULAR | Status: DC | PRN
  Administered 2016-06-26: 2 mg via INTRAVENOUS

## 2016-06-26 MED ORDER — SCOPOLAMINE 1 MG/3DAYS TD PT72
MEDICATED_PATCH | TRANSDERMAL | Status: AC
  Filled 2016-06-26: qty 1

## 2016-06-26 MED ORDER — MEPERIDINE HCL 50 MG/ML IJ SOLN
6.2500 mg | INTRAMUSCULAR | Status: DC | PRN
  Administered 2016-06-26: 6.25 mg via INTRAVENOUS

## 2016-06-26 MED ORDER — LIDOCAINE HCL (CARDIAC) 20 MG/ML IV SOLN
INTRAVENOUS | Status: DC | PRN
  Administered 2016-06-26: 100 mg via INTRAVENOUS

## 2016-06-26 MED ORDER — SODIUM CHLORIDE 0.9 % IV SOLN: 250.0000 mL | INTRAVENOUS | Status: DC | PRN

## 2016-06-26 SURGICAL SUPPLY — 33 items
APL SKNCLS STERI-STRIP NONHPOA (GAUZE/BANDAGES/DRESSINGS) ×1
BAG DECANTER FOR FLEXI CONT (MISCELLANEOUS) ×3 IMPLANT
BENZOIN TINCTURE PRP APPL 2/3 (GAUZE/BANDAGES/DRESSINGS) ×2 IMPLANT
BLADE HEX COATED 2.75 (ELECTRODE) ×3 IMPLANT
BLADE SURG 15 STRL LF DISP TIS (BLADE) ×1 IMPLANT
BLADE SURG 15 STRL SS (BLADE) ×3
CHLORAPREP W/TINT 26ML (MISCELLANEOUS) ×3 IMPLANT
CLOSURE WOUND 1/2 X4 (GAUZE/BANDAGES/DRESSINGS) ×2
COVER SURGICAL LIGHT HANDLE (MISCELLANEOUS) ×1 IMPLANT
DECANTER SPIKE VIAL GLASS SM (MISCELLANEOUS) ×3 IMPLANT
DRAPE C-ARM 42X120 X-RAY (DRAPES) ×3 IMPLANT
DRAPE LAPAROSCOPIC ABDOMINAL (DRAPES) ×3 IMPLANT
DRSG TEGADERM 4X4.75 (GAUZE/BANDAGES/DRESSINGS) ×4 IMPLANT
ELECT PENCIL ROCKER SW 15FT (MISCELLANEOUS) ×3 IMPLANT
ELECT REM PT RETURN 9FT ADLT (ELECTROSURGICAL) ×3
ELECTRODE REM PT RTRN 9FT ADLT (ELECTROSURGICAL) ×1 IMPLANT
GAUZE SPONGE 4X4 16PLY XRAY LF (GAUZE/BANDAGES/DRESSINGS) ×3 IMPLANT
GLOVE EUDERMIC 7 POWDERFREE (GLOVE) ×3 IMPLANT
GOWN STRL REUS W/TWL XL LVL3 (GOWN DISPOSABLE) ×6 IMPLANT
KIT BASIN OR (CUSTOM PROCEDURE TRAY) ×3 IMPLANT
KIT PORT POWER 8FR ISP CVUE (Catheter) ×3 IMPLANT
MARKER SKIN DUAL TIP RULER LAB (MISCELLANEOUS) ×3 IMPLANT
NEEDLE HYPO 22GX1.5 SAFETY (NEEDLE) ×3 IMPLANT
PACK BASIC VI WITH GOWN DISP (CUSTOM PROCEDURE TRAY) ×3 IMPLANT
STRIP CLOSURE SKIN 1/2X4 (GAUZE/BANDAGES/DRESSINGS) ×2 IMPLANT
SUT MNCRL AB 4-0 PS2 18 (SUTURE) ×3 IMPLANT
SUT PROLENE 2 0 SH DA (SUTURE) ×3 IMPLANT
SUT VIC AB 3-0 SH 18 (SUTURE) ×3 IMPLANT
SYR 20CC LL (SYRINGE) ×3 IMPLANT
SYR BULB IRRIGATION 50ML (SYRINGE) IMPLANT
SYRINGE 10CC LL (SYRINGE) ×3 IMPLANT
TOWEL OR 17X26 10 PK STRL BLUE (TOWEL DISPOSABLE) ×3 IMPLANT
TOWEL OR NON WOVEN STRL DISP B (DISPOSABLE) ×3 IMPLANT

## 2016-06-26 NOTE — Interval H&P Note (Signed)
History and Physical Interval Note:  06/26/2016 6:50 AM  Mercedes Nelson  has presented today for surgery, with the diagnosis of breast cancer  The various methods of treatment have been discussed with the patient and family. After consideration of risks, benefits and other options for treatment, the patient has consented to  Procedure(s): INSERTION PORT-A-CATH WITH [place] (N/A) as a surgical intervention .  The patient's history has been reviewed, patient examined, no change in status, stable for surgery.  I have reviewed the patient's chart and labs.  Questions were answered to the patient's satisfaction.     Adin Hector

## 2016-06-26 NOTE — Anesthesia Postprocedure Evaluation (Signed)
Anesthesia Post Note  Patient: Mercedes Nelson  Procedure(s) Performed: Procedure(s) (LRB): INSERTION PORT-A-CATH WITH [place] (Left)  Patient location during evaluation: PACU Anesthesia Type: General Level of consciousness: awake and alert Pain management: pain level controlled Vital Signs Assessment: post-procedure vital signs reviewed and stable Respiratory status: spontaneous breathing, nonlabored ventilation, respiratory function stable and patient connected to nasal cannula oxygen Cardiovascular status: blood pressure returned to baseline and stable Postop Assessment: no signs of nausea or vomiting Anesthetic complications: no    Last Vitals:  Filed Vitals:   06/26/16 0530 06/26/16 0852  BP: 134/67 129/60  Pulse: 61 70  Temp: 37.1 C 36.4 C  Resp: 16 15    Last Pain: There were no vitals filed for this visit.               Bayan Hedstrom S

## 2016-06-26 NOTE — Anesthesia Preprocedure Evaluation (Signed)
Anesthesia Evaluation  Patient identified by MRN, date of birth, ID band Patient awake    Reviewed: Allergy & Precautions, NPO status , Patient's Chart, lab work & pertinent test results  History of Anesthesia Complications (+) PONV  Airway Mallampati: II  TM Distance: >3 FB Neck ROM: Full    Dental no notable dental hx.    Pulmonary neg pulmonary ROS, former smoker,    Pulmonary exam normal breath sounds clear to auscultation       Cardiovascular negative cardio ROS Normal cardiovascular exam Rhythm:Regular Rate:Normal     Neuro/Psych negative neurological ROS  negative psych ROS   GI/Hepatic negative GI ROS, Neg liver ROS,   Endo/Other  negative endocrine ROS  Renal/GU negative Renal ROS  negative genitourinary   Musculoskeletal negative musculoskeletal ROS (+)   Abdominal   Peds negative pediatric ROS (+)  Hematology negative hematology ROS (+)   Anesthesia Other Findings   Reproductive/Obstetrics negative OB ROS                             Anesthesia Physical Anesthesia Plan  ASA: II  Anesthesia Plan: General   Post-op Pain Management:    Induction: Intravenous  Airway Management Planned: LMA  Additional Equipment:   Intra-op Plan:   Post-operative Plan: Extubation in OR  Informed Consent: I have reviewed the patients History and Physical, chart, labs and discussed the procedure including the risks, benefits and alternatives for the proposed anesthesia with the patient or authorized representative who has indicated his/her understanding and acceptance.   Dental advisory given  Plan Discussed with: CRNA and Surgeon  Anesthesia Plan Comments:         Anesthesia Quick Evaluation

## 2016-06-26 NOTE — Op Note (Signed)
Patient Name:           Mercedes Nelson   Date of Surgery:        06/26/2016  Pre op Diagnosis:      Cancer right breast  Post op Diagnosis:    Cancer right breast  Procedure:                 Insertion of 8 [place] to [place]  tunneled venous vascular access device, use of fluoroscopy for guidance and positioning  Surgeon:                     Edsel Petrin. Dalbert Batman, M.D., FACS  Assistant:                      OR staff  Operative Indications:  . This is a 65 year old female who returns for her first postop visit following definitive surgery for her right breast cancer.      On May 26, 2006 she underwent right partial mastectomy and sentinel node biopsy. She has done well.      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 ER 90%. PR 20%. HER-2 negative. Ki-67 15%. Stage TIc, N0. She has seen Dr. Lindi Adie who recommend chemotherapy due to oncotype score 26/17% risk.       I discussed the indications, details, techniques can, and numerous risk of Port-A-Cath insertion.  She is aware the risk of bleeding, infection, bilateral attempts, air embolus, pneumothorax, delayed malfunction requiring revision, and other unforeseen problems.  She understands these issues well.  All her questions are answered.  She agrees with this plan.    Operative Findings:       The port was placed through the left subclavian position.  Fluoroscopy in the operating room revealed that the catheter tip was in the superior vena cava.  There was excellent blood return and the catheter flushed easily.  The port was left accessed in anticipation of chemotherapy starting tomorrow.  Procedure in Detail:          Following the induction of general LMA anesthesia a surgical timeout was performed.  The patient was positioned with a roll behind her shoulders and her arms tucked at her sides.  The neck and chest were prepped and draped in a sterile fashion.   Intravenous antibiotics were given.  0.5% Marcaine with epinephrine was used as local infiltration anesthetic.     A left subclavian venipuncture was performed without difficulty.  Excellent blood return.  Guidewire threaded into the superior vena cava under fluoroscopic guidance.  A small incision was made at the wire insertion site.  Using the C-arm and a marking pen I marked a template on the chest wall to measure the catheter so that the tip would be in the superior vena cava.  A transverse incision was made about 3 cm below the clavicle on the left side.  A pocket was created in the deep subcutaneous space.  Using a tunneling device I passed the catheter from the port pocket site to the wire insertion site.  I then used a template to measure the catheter and cut it 24 cm in length.  The catheter was then secured to the port with the locking device and the port and catheter flushed with heparinized saline.  The port was sutured to the pectoralis fascia with 3 interrupted sutures of 2-0 Prolene.  The dilator and peel-away  sheath assembly was inserted over the guidewire without difficulty.  The dilator and wire were removed.  The catheter was threaded easily and the peel-away sheath removed.  We had excellent blood return and the catheter flushed easily.  The catheter tip was seen to be positioned in the superior  vena cava and there was no deformity of the catheter anywhere along its course.  The subcutaneous tissue was closed with 3-0 Vicryl sutures and the skin closed with subcuticular 4-0 Monocryl and Steri-Strips.  I then brought the angled Huber needle with extension tubing flushed with heparin to the table and inserted this through the skin into the port.  I again confirmed good blood return and good flushing.  The access tubing was then flushed with concentrated heparin and the tubing secured to the skin with multiple long Steri-Strips and a Tegaderm.  The patient tolerated the procedure well was taken  to PACU in stable condition where chest x-ray will be obtained.  EBL 15 mL.  Counts correct.  Complications none.     Edsel Petrin. Dalbert Batman, M.D., FACS General and Minimally Invasive Surgery Breast and Colorectal Surgery  06/26/2016 8:44 AM

## 2016-06-26 NOTE — Anesthesia Procedure Notes (Signed)
Procedure Name: LMA Insertion Date/Time: 06/26/2016 7:35 AM Performed by: Christell Faith L Pre-anesthesia Checklist: Patient identified, Emergency Drugs available, Suction available, Patient being monitored and Timeout performed Patient Re-evaluated:Patient Re-evaluated prior to inductionOxygen Delivery Method: Circle system utilized Preoxygenation: Pre-oxygenation with 100% oxygen Intubation Type: IV induction Ventilation: Mask ventilation without difficulty LMA: LMA inserted LMA Size: 4.0 Number of attempts: 1 Tube secured with: Tape Dental Injury: Teeth and Oropharynx as per pre-operative assessment

## 2016-06-26 NOTE — Progress Notes (Signed)
Sent prior auth req for zofran-covermymeds. I will let [facility] know once approved  [place]

## 2016-06-26 NOTE — Transfer of Care (Signed)
Immediate Anesthesia Transfer of Care Note  Patient: Mercedes Nelson  Procedure(s) Performed: Procedure(s): INSERTION PORT-A-CATH WITH [place] (Left)  Patient Location: PACU  Anesthesia Type:General  Level of Consciousness:  sedated, patient cooperative and responds to stimulation  Airway & Oxygen Therapy:Patient Spontanous Breathing and Patient connected to face mask oxgen  Post-op Assessment:  Report given to PACU RN and Post -op Vital signs reviewed and stable  Post vital signs:  Reviewed and stable  Last Vitals:  Filed Vitals:   06/26/16 0530  BP: 134/67  Pulse: 61  Temp: 37.1 C  Resp: 16    Complications: No apparent anesthesia complications

## 2016-06-26 NOTE — Discharge Instructions (Signed)

## 2016-06-27 ENCOUNTER — Ambulatory Visit: Payer: PPO | Admitting: Hematology and Oncology

## 2016-06-27 ENCOUNTER — Other Ambulatory Visit (HOSPITAL_BASED_OUTPATIENT_CLINIC_OR_DEPARTMENT_OTHER): Payer: PPO

## 2016-06-27 ENCOUNTER — Telehealth: Payer: Self-pay | Admitting: Hematology and Oncology

## 2016-06-27 ENCOUNTER — Ambulatory Visit (HOSPITAL_BASED_OUTPATIENT_CLINIC_OR_DEPARTMENT_OTHER): Payer: PPO

## 2016-06-27 ENCOUNTER — Encounter: Payer: Self-pay | Admitting: Hematology and Oncology

## 2016-06-27 ENCOUNTER — Ambulatory Visit (HOSPITAL_BASED_OUTPATIENT_CLINIC_OR_DEPARTMENT_OTHER): Payer: PPO | Admitting: Hematology and Oncology

## 2016-06-27 VITALS — BP 117/58 | HR 54 | Temp 98.5°F | Resp 17 | Ht 68.0 in | Wt 174.4 lb

## 2016-06-27 VITALS — BP 117/72 | HR 49 | Temp 97.9°F | Resp 18

## 2016-06-27 DIAGNOSIS — Z5111 Encounter for antineoplastic chemotherapy: Secondary | ICD-10-CM

## 2016-06-27 DIAGNOSIS — C50411 Malignant neoplasm of upper-outer quadrant of right female breast: Secondary | ICD-10-CM

## 2016-06-27 LAB — CBC WITH DIFFERENTIAL/PLATELET
BASO%: 0 % (ref 0.0–2.0)
Basophils Absolute: 0 10*3/uL (ref 0.0–0.1)
EOS ABS: 0 10*3/uL (ref 0.0–0.5)
EOS%: 0 % (ref 0.0–7.0)
HEMATOCRIT: 40.1 % (ref 34.8–46.6)
HGB: 13.4 g/dL (ref 11.6–15.9)
LYMPH#: 1.3 10*3/uL (ref 0.9–3.3)
LYMPH%: 8.9 % — AB (ref 14.0–49.7)
MCH: 30.3 pg (ref 25.1–34.0)
MCHC: 33.4 g/dL (ref 31.5–36.0)
MCV: 90.7 fL (ref 79.5–101.0)
MONO#: 1.2 10*3/uL — AB (ref 0.1–0.9)
MONO%: 7.9 % (ref 0.0–14.0)
NEUT%: 83.2 % — AB (ref 38.4–76.8)
NEUTROS ABS: 12.5 10*3/uL — AB (ref 1.5–6.5)
PLATELETS: 226 10*3/uL (ref 145–400)
RBC: 4.42 10*6/uL (ref 3.70–5.45)
RDW: 13 % (ref 11.2–14.5)
WBC: 15 10*3/uL — AB (ref 3.9–10.3)

## 2016-06-27 LAB — COMPREHENSIVE METABOLIC PANEL
ALT: 19 U/L (ref 0–55)
ANION GAP: 10 meq/L (ref 3–11)
AST: 16 U/L (ref 5–34)
Albumin: 3.7 g/dL (ref 3.5–5.0)
Alkaline Phosphatase: 71 U/L (ref 40–150)
BUN: 14.7 mg/dL (ref 7.0–26.0)
CALCIUM: 9.7 mg/dL (ref 8.4–10.4)
CHLORIDE: 106 meq/L (ref 98–109)
CO2: 27 meq/L (ref 22–29)
Creatinine: 0.8 mg/dL (ref 0.6–1.1)
EGFR: 75 mL/min/{1.73_m2} — AB (ref >90)
GLUCOSE: 86 mg/dL (ref 70–140)
Potassium: 4.5 mEq/L (ref 3.5–5.1)
Sodium: 142 mEq/L (ref 136–145)
TOTAL PROTEIN: 6.5 g/dL (ref 6.4–8.3)
Total Bilirubin: 1.36 mg/dL — ABNORMAL HIGH (ref 0.20–1.20)

## 2016-06-27 MED ORDER — PALONOSETRON HCL INJECTION 0.25 MG/5ML
0.2500 mg | Freq: Once | INTRAVENOUS | Status: AC
  Administered 2016-06-27: 0.25 mg via INTRAVENOUS

## 2016-06-27 MED ORDER — DEXAMETHASONE SODIUM PHOSPHATE 100 MG/10ML IJ SOLN
10.0000 mg | Freq: Once | INTRAMUSCULAR | Status: AC
  Administered 2016-06-27: 10 mg via INTRAVENOUS
  Filled 2016-06-27: qty 1

## 2016-06-27 MED ORDER — HEPARIN SOD (PORK) LOCK FLUSH 100 UNIT/ML IV SOLN
500.0000 [IU] | Freq: Once | INTRAVENOUS | Status: AC
  Administered 2016-06-27: 500 [IU] via INTRAVENOUS
  Filled 2016-06-27: qty 5

## 2016-06-27 MED ORDER — PEGFILGRASTIM 6 MG/0.6ML ~~LOC~~ PSKT
6.0000 mg | PREFILLED_SYRINGE | Freq: Once | SUBCUTANEOUS | Status: AC
  Administered 2016-06-27: 6 mg via SUBCUTANEOUS
  Filled 2016-06-27: qty 0.6

## 2016-06-27 MED ORDER — SODIUM CHLORIDE 0.9 % IV SOLN
75.0000 mg/m2 | Freq: Once | INTRAVENOUS | Status: AC
  Administered 2016-06-27: 150 mg via INTRAVENOUS
  Filled 2016-06-27: qty 15

## 2016-06-27 MED ORDER — SODIUM CHLORIDE 0.9 % IV SOLN
Freq: Once | INTRAVENOUS | Status: AC
  Administered 2016-06-27: 11:00:00 via INTRAVENOUS

## 2016-06-27 MED ORDER — SODIUM CHLORIDE 0.9% FLUSH
10.0000 mL | INTRAVENOUS | Status: DC | PRN
  Administered 2016-06-27: 10 mL via INTRAVENOUS
  Filled 2016-06-27: qty 10

## 2016-06-27 MED ORDER — SODIUM CHLORIDE 0.9 % IV SOLN
600.0000 mg/m2 | Freq: Once | INTRAVENOUS | Status: AC
  Administered 2016-06-27: 1160 mg via INTRAVENOUS
  Filled 2016-06-27: qty 58

## 2016-06-27 MED ORDER — PALONOSETRON HCL INJECTION 0.25 MG/5ML
INTRAVENOUS | Status: AC
  Filled 2016-06-27: qty 5

## 2016-06-27 NOTE — Assessment & Plan Note (Signed)
Right lumpectomy 05/26/2016: IDC with DCIS 1.6 cm, margins, 0/2 lymph nodes negative, ER 100%, PR 20%, Ki-67 15%, HER-2 negative ratio 1.0 T1 BN 0 stage IA pathologic stage Oncotype DX 26, 17% risk of recurrence  Recommendation: 1. Adjuvant chemotherapy with Taxotere and Cytoxan followed by 2. Adjuvant radiation therapy followed by 3. Adjuvant antiestrogen therapy ------------------------------------------------------------------------------------------------ Current treatment: Cycle 1 day 1 Taxotere and Cytoxan Labs are reviewed Antiemetics were reviewed Monitoring closely for chemotherapy toxicities Return to clinic in 1 week for toxicity check.

## 2016-06-27 NOTE — Progress Notes (Signed)
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[place])   04/05/2016 Mammogram Asymmetry in the right breast lateral middle depth, 11 mm non-circumscribed mass ([facility] mammogram)   04/05/2016 Initial Diagnosis Right breast biopsy: Invasive ductal carcinoma ER 90-100%, PR 20%, Ki-67 20%, HER-2 Neg, T1b N0 stage IA clinical stage   05/26/2016 Surgery Right lumpectomy: IDC with DCIS 1.6 and goes, margins, 0/2 lymph nodes negative, ER 100%, PR 20%, Ki-67 15%, HER-2 negative ratio 1.0 T1 BN 0 stage IA pathologic stage, Oncotype DX 26, 17% ROR   06/27/2016 -  Chemotherapy Adjuvant chemotherapy with Taxotere and Cytoxan 4    CHIEF COMPLIANT: Cycle 1 day 1 Taxotere and Cytoxan  INTERVAL HISTORY: Mercedes Nelson is a 65 year old with above-mentioned history right breast cancer currently on adjuvant chemotherapy and today's cycle one of the treatment. She had a port placement and has done quite well from that. She was anxious and could not sleep last night.   REVIEW OF SYSTEMS:   Constitutional: Denies fevers, chills or abnormal weight loss Eyes: Denies blurriness of vision Ears, nose, mouth, throat, and face: Denies mucositis or sore throat Respiratory: Denies cough, dyspnea or wheezes Cardiovascular: Denies palpitation, chest discomfort Gastrointestinal:  Denies nausea, heartburn or change in bowel habits Skin: Denies abnormal skin rashes Lymphatics: Denies new lymphadenopathy or easy bruising Neurological:Denies numbness, tingling or new weaknesses Behavioral/Psych: Anxious with starting chemotherapy  Extremities: No lower extremity edema Breast:  denies any pain or lumps or nodules in either breasts All other systems were reviewed with the patient and are negative.  I have reviewed the past medical history, past surgical history, social history and family history with the patient and they  are unchanged from previous note.  ALLERGIES:  has No Known Allergies.  MEDICATIONS:  Current Outpatient Prescriptions  Medication Sig Dispense Refill  . dexamethasone (DECADRON) 4 MG tablet Take 1 tablet (4 mg total) by mouth daily. Start the day before Taxotere. Then again the day after chemo X 1 day 15 tablet 0  . HYDROcodone-acetaminophen (NORCO) 5-325 MG tablet Take 1-2 tablets by mouth every 6 (six) hours as needed. 30 tablet 0  . lidocaine-prilocaine (EMLA) cream Apply to affected area once 30 g 3  . Multiple Vitamin (MULTIVITAMIN) tablet Take 1 tablet by mouth daily.    . naproxen sodium (ANAPROX) 220 MG tablet Take 220 mg by mouth as needed (pain).     . ondansetron (ZOFRAN) 8 MG tablet Take 1 tablet (8 mg total) by mouth 2 (two) times daily as needed for refractory nausea / vomiting. Start on day 3 after chemo. 30 tablet 1  . prochlorperazine (COMPAZINE) 10 MG tablet Take 1 tablet (10 mg total) by mouth every 6 (six) hours as needed (Nausea or vomiting). 30 tablet 1  . simvastatin (ZOCOR) 40 MG tablet Take 20 mg by mouth daily.      No current facility-administered medications for this visit.    PHYSICAL EXAMINATION: ECOG PERFORMANCE STATUS: 1 - Symptomatic but completely ambulatory  Filed Vitals:   06/27/16 0959  BP: 117/58  Pulse: 54  Temp: 98.5 F (36.9 C)  Resp: 17   Filed Weights   06/27/16 0959  Weight: 174 lb 6.4 oz (79.107 kg)    GENERAL:alert, no distress and comfortable SKIN: skin color, texture, turgor are normal, no rashes or significant lesions EYES: normal, Conjunctiva are pink and non-injected, sclera clear  OROPHARYNX:no exudate, no erythema and lips, buccal mucosa, and tongue normal  NECK: supple, thyroid normal size, non-tender, without nodularity LYMPH:  no palpable lymphadenopathy in the cervical, axillary or inguinal LUNGS: clear to auscultation and percussion with normal breathing effort HEART: regular rate & rhythm and no murmurs and no lower  extremity edema ABDOMEN:abdomen soft, non-tender and normal bowel sounds MUSCULOSKELETAL:no cyanosis of digits and no clubbing  NEURO: alert & oriented x 3 with fluent speech, no focal motor/sensory deficits EXTREMITIES: No lower extremity edema  LABORATORY DATA:  I have reviewed the data as listed   Chemistry      Component Value Date/Time   NA 140 06/23/2016 1530   K 4.6 06/23/2016 1530   CL 106 06/23/2016 1530   CO2 30 06/23/2016 1530   BUN 17 06/23/2016 1530   CREATININE 0.72 06/23/2016 1530      Component Value Date/Time   CALCIUM 8.9 06/23/2016 1530   ALKPHOS 59 05/24/2016 1305   AST 22 05/24/2016 1305   ALT 24 05/24/2016 1305   BILITOT 1.2 05/24/2016 1305       Lab Results  Component Value Date   WBC 15.0* 06/27/2016   HGB 13.4 06/27/2016   HCT 40.1 06/27/2016   MCV 90.7 06/27/2016   PLT 226 06/27/2016   NEUTROABS 12.5* 06/27/2016   ASSESSMENT & PLAN:  Malignant neoplasm of upper-outer quadrant of right female breast ([place]) Right lumpectomy 05/26/2016: IDC with DCIS 1.6 cm, margins, 0/2 lymph nodes negative, ER 100%, PR 20%, Ki-67 15%, HER-2 negative ratio 1.0 T1 BN 0 stage IA pathologic stage Oncotype DX 26, 17% risk of recurrence  Recommendation: 1. Adjuvant chemotherapy with Taxotere and Cytoxan followed by 2. Adjuvant radiation therapy followed by 3. Adjuvant antiestrogen therapy ------------------------------------------------------------------------------------------------ Current treatment: Cycle 1 day 1 Taxotere and Cytoxan Labs are reviewed Antiemetics were reviewed Monitoring closely for chemotherapy toxicities Return to clinic in 1 week for toxicity check.   No orders of the defined types were placed in this encounter.   The patient has a good understanding of the overall plan. she agrees with it. she will call with any problems that may develop before the next visit here.   Rulon Eisenmenger, MD 06/27/2016

## 2016-06-27 NOTE — Telephone Encounter (Signed)
appt made and avs printed °

## 2016-06-27 NOTE — Patient Instructions (Signed)
[facility] Discharge Instructions for Patients Receiving Chemotherapy  Today you received the following chemotherapy agents taxotere/cytoxan  To help prevent nausea and vomiting after your treatment, we encourage you to take your nausea medication as directed DO NOT TAKE ZOFRAN FOR 3 DAYS FOLLOWING CHEMOTHERAPY.     If you develop nausea and vomiting that is not controlled by your nausea medication, call the clinic.   BELOW ARE SYMPTOMS THAT SHOULD BE REPORTED IMMEDIATELY:  *FEVER GREATER THAN 100.5 F  *CHILLS WITH OR WITHOUT FEVER  NAUSEA AND VOMITING THAT IS NOT CONTROLLED WITH YOUR NAUSEA MEDICATION  *UNUSUAL SHORTNESS OF BREATH  *UNUSUAL BRUISING OR BLEEDING  TENDERNESS IN MOUTH AND THROAT WITH OR WITHOUT PRESENCE OF ULCERS  *URINARY PROBLEMS  *BOWEL PROBLEMS  UNUSUAL RASH Items with * indicate a potential emergency and should be followed up as soon as possible.  Feel free to call the clinic you have any questions or concerns. The clinic phone number is [phone].  Docetaxel injection What is this medicine? DOCETAXEL (doe se TAX el) is a chemotherapy drug. It targets fast dividing cells, like cancer cells, and causes these cells to die. This medicine is used to treat many types of cancers like breast cancer, certain stomach cancers, head and neck cancer, lung cancer, and prostate cancer. This medicine may be used for other purposes; ask your health care provider or pharmacist if you have questions. What should I tell my health care provider before I take this medicine? They need to know if you have any of these conditions: -infection (especially a virus infection such as chickenpox, cold sores, or herpes) -liver disease -low blood counts, like low white cell, platelet, or red cell counts -an unusual or allergic reaction to docetaxel, polysorbate 80, other chemotherapy agents, other medicines, foods, dyes, or preservatives -pregnant or trying to get  pregnant -breast-feeding How should I use this medicine? This drug is given as an infusion into a vein. It is administered in a hospital or clinic by a specially trained health care professional. Talk to your pediatrician regarding the use of this medicine in children. Special care may be needed. Overdosage: If you think you have taken too much of this medicine contact a poison control center or emergency room at once. NOTE: This medicine is only for you. Do not share this medicine with others. What if I miss a dose? It is important not to miss your dose. Call your doctor or health care professional if you are unable to keep an appointment. What may interact with this medicine? -cyclosporine -erythromycin -ketoconazole -medicines to increase blood counts like filgrastim, pegfilgrastim, sargramostim -vaccines Talk to your doctor or health care professional before taking any of these medicines: -acetaminophen -aspirin -ibuprofen -ketoprofen -naproxen This list may not describe all possible interactions. Give your health care provider a list of all the medicines, herbs, non-prescription drugs, or dietary supplements you use. Also tell them if you smoke, drink alcohol, or use illegal drugs. Some items may interact with your medicine. What should I watch for while using this medicine? Your condition will be monitored carefully while you are receiving this medicine. You will need important blood work done while you are taking this medicine. This drug may make you feel generally unwell. This is not uncommon, as chemotherapy can affect healthy cells as well as cancer cells. Report any side effects. Continue your course of treatment even though you feel ill unless your doctor tells you to stop. In some cases, you may  be given additional medicines to help with side effects. Follow all directions for their use. Call your doctor or health care professional for advice if you get a fever, chills or sore  throat, or other symptoms of a cold or flu. Do not treat yourself. This drug decreases your body's ability to fight infections. Try to avoid being around people who are sick. This medicine may increase your risk to bruise or bleed. Call your doctor or health care professional if you notice any unusual bleeding. This medicine may contain alcohol in the product. You may get drowsy or dizzy. Do not drive, use machinery, or do anything that needs mental alertness until you know how this medicine affects you. Do not stand or sit up quickly, especially if you are an older patient. This reduces the risk of dizzy or fainting spells. Avoid alcoholic drinks. Do not become pregnant while taking this medicine. Women should inform their doctor if they wish to become pregnant or think they might be pregnant. There is a potential for serious side effects to an unborn child. Talk to your health care professional or pharmacist for more information. Do not breast-feed an infant while taking this medicine. What side effects may I notice from receiving this medicine? Side effects that you should report to your doctor or health care professional as soon as possible: -allergic reactions like skin rash, itching or hives, swelling of the face, lips, or tongue -low blood counts - This drug may decrease the number of white blood cells, red blood cells and platelets. You may be at increased risk for infections and bleeding. -signs of infection - fever or chills, cough, sore throat, pain or difficulty passing urine -signs of decreased platelets or bleeding - bruising, pinpoint red spots on the skin, black, tarry stools, nosebleeds -signs of decreased red blood cells - unusually weak or tired, fainting spells, lightheadedness -breathing problems -fast or irregular heartbeat -low blood pressure -mouth sores -nausea and vomiting -pain, swelling, redness or irritation at the injection site -pain, tingling, numbness in the hands or  feet -swelling of the ankle, feet, hands -weight gain Side effects that usually do not require medical attention (report to your prescriber or health care professional if they continue or are bothersome): -bone pain -complete hair loss including hair on your head, underarms, pubic hair, eyebrows, and eyelashes -diarrhea -excessive tearing -changes in the color of fingernails -loosening of the fingernails -nausea -muscle pain -red flush to skin -sweating -weak or tired This list may not describe all possible side effects. Call your doctor for medical advice about side effects. You may report side effects to FDA at 1-800-FDA-1088. Where should I keep my medicine? This drug is given in a hospital or clinic and will not be stored at home. NOTE: This sheet is a summary. It may not cover all possible information. If you have questions about this medicine, talk to your doctor, pharmacist, or health care provider.    2016, Elsevier/Gold Standard. (2014-12-21 16:04:57)  Cyclophosphamide injection What is this medicine? CYCLOPHOSPHAMIDE (sye kloe FOSS fa mide) is a chemotherapy drug. It slows the growth of cancer cells. This medicine is used to treat many types of cancer like lymphoma, myeloma, leukemia, breast cancer, and ovarian cancer, to name a few. This medicine may be used for other purposes; ask your health care provider or pharmacist if you have questions. What should I tell my health care provider before I take this medicine? They need to know if you have any of these  conditions: -blood disorders -history of other chemotherapy -infection -kidney disease -liver disease -recent or ongoing radiation therapy -tumors in the bone marrow -an unusual or allergic reaction to cyclophosphamide, other chemotherapy, other medicines, foods, dyes, or preservatives -pregnant or trying to get pregnant -breast-feeding How should I use this medicine? This drug is usually given as an injection  into a vein or muscle or by infusion into a vein. It is administered in a hospital or clinic by a specially trained health care professional. Talk to your pediatrician regarding the use of this medicine in children. Special care may be needed. Overdosage: If you think you have taken too much of this medicine contact a poison control center or emergency room at once. NOTE: This medicine is only for you. Do not share this medicine with others. What if I miss a dose? It is important not to miss your dose. Call your doctor or health care professional if you are unable to keep an appointment. What may interact with this medicine? This medicine may interact with the following medications: -amiodarone -amphotericin B -azathioprine -certain antiviral medicines for HIV or AIDS such as protease inhibitors (e.g., indinavir, ritonavir) and zidovudine -certain blood pressure medications such as benazepril, captopril, enalapril, fosinopril, lisinopril, moexipril, monopril, perindopril, quinapril, ramipril, trandolapril -certain cancer medications such as anthracyclines (e.g., daunorubicin, doxorubicin), busulfan, cytarabine, paclitaxel, pentostatin, tamoxifen, trastuzumab -certain diuretics such as chlorothiazide, chlorthalidone, hydrochlorothiazide, indapamide, metolazone -certain medicines that treat or prevent blood clots like warfarin -certain muscle relaxants such as succinylcholine -cyclosporine -etanercept -indomethacin -medicines to increase blood counts like filgrastim, pegfilgrastim, sargramostim -medicines used as general anesthesia -metronidazole -natalizumab This list may not describe all possible interactions. Give your health care provider a list of all the medicines, herbs, non-prescription drugs, or dietary supplements you use. Also tell them if you smoke, drink alcohol, or use illegal drugs. Some items may interact with your medicine. What should I watch for while using this  medicine? Visit your doctor for checks on your progress. This drug may make you feel generally unwell. This is not uncommon, as chemotherapy can affect healthy cells as well as cancer cells. Report any side effects. Continue your course of treatment even though you feel ill unless your doctor tells you to stop. Drink water or other fluids as directed. Urinate often, even at night. In some cases, you may be given additional medicines to help with side effects. Follow all directions for their use. Call your doctor or health care professional for advice if you get a fever, chills or sore throat, or other symptoms of a cold or flu. Do not treat yourself. This drug decreases your body's ability to fight infections. Try to avoid being around people who are sick. This medicine may increase your risk to bruise or bleed. Call your doctor or health care professional if you notice any unusual bleeding. Be careful brushing and flossing your teeth or using a toothpick because you may get an infection or bleed more easily. If you have any dental work done, tell your dentist you are receiving this medicine. You may get drowsy or dizzy. Do not drive, use machinery, or do anything that needs mental alertness until you know how this medicine affects you. Do not become pregnant while taking this medicine or for 1 year after stopping it. Women should inform their doctor if they wish to become pregnant or think they might be pregnant. Men should not father a child while taking this medicine and for 4 months after stopping it. There  is a potential for serious side effects to an unborn child. Talk to your health care professional or pharmacist for more information. Do not breast-feed an infant while taking this medicine. This medicine may interfere with the ability to have a child. This medicine has caused ovarian failure in some women. This medicine has caused reduced sperm counts in some men. You should talk with your doctor or  health care professional if you are concerned about your fertility. If you are going to have surgery, tell your doctor or health care professional that you have taken this medicine. What side effects may I notice from receiving this medicine? Side effects that you should report to your doctor or health care professional as soon as possible: -allergic reactions like skin rash, itching or hives, swelling of the face, lips, or tongue -low blood counts - this medicine may decrease the number of white blood cells, red blood cells and platelets. You may be at increased risk for infections and bleeding. -signs of infection - fever or chills, cough, sore throat, pain or difficulty passing urine -signs of decreased platelets or bleeding - bruising, pinpoint red spots on the skin, black, tarry stools, blood in the urine -signs of decreased red blood cells - unusually weak or tired, fainting spells, lightheadedness -breathing problems -dark urine -dizziness -palpitations -swelling of the ankles, feet, hands -trouble passing urine or change in the amount of urine -weight gain -yellowing of the eyes or skin Side effects that usually do not require medical attention (report to your doctor or health care professional if they continue or are bothersome): -changes in nail or skin color -hair loss -missed menstrual periods -mouth sores -nausea, vomiting This list may not describe all possible side effects. Call your doctor for medical advice about side effects. You may report side effects to FDA at 1-800-FDA-1088. Where should I keep my medicine? This drug is given in a hospital or clinic and will not be stored at home. NOTE: This sheet is a summary. It may not cover all possible information. If you have questions about this medicine, talk to your doctor, pharmacist, or health care provider.    2016, Elsevier/Gold Standard. (2012-10-18 16:22:58)

## 2016-06-28 ENCOUNTER — Telehealth: Payer: Self-pay | Admitting: *Deleted

## 2016-06-28 NOTE — Telephone Encounter (Signed)
Spoke with patient to follow up after her 1st chemo.  She states she is doing well. She stated it was a long day because her chemo was not approved when she got her which delayed her treatment.  Encouraged her to call with any needs or questions.

## 2016-06-30 ENCOUNTER — Ambulatory Visit (HOSPITAL_BASED_OUTPATIENT_CLINIC_OR_DEPARTMENT_OTHER): Payer: PPO

## 2016-06-30 ENCOUNTER — Ambulatory Visit (HOSPITAL_BASED_OUTPATIENT_CLINIC_OR_DEPARTMENT_OTHER): Payer: PPO | Admitting: Nurse Practitioner

## 2016-06-30 ENCOUNTER — Other Ambulatory Visit: Payer: Self-pay | Admitting: Nurse Practitioner

## 2016-06-30 ENCOUNTER — Telehealth: Payer: Self-pay | Admitting: *Deleted

## 2016-06-30 VITALS — BP 104/55 | HR 79 | Temp 98.1°F | Resp 18

## 2016-06-30 VITALS — BP 116/47 | HR 69 | Temp 98.9°F | Resp 18 | Ht 68.0 in | Wt 175.4 lb

## 2016-06-30 DIAGNOSIS — C50411 Malignant neoplasm of upper-outer quadrant of right female breast: Secondary | ICD-10-CM

## 2016-06-30 DIAGNOSIS — Z95828 Presence of other vascular implants and grafts: Secondary | ICD-10-CM | POA: Insufficient documentation

## 2016-06-30 DIAGNOSIS — E860 Dehydration: Secondary | ICD-10-CM

## 2016-06-30 LAB — CBC WITH DIFFERENTIAL/PLATELET
BASO%: 0.3 % (ref 0.0–2.0)
Basophils Absolute: 0 10*3/uL (ref 0.0–0.1)
EOS%: 0.8 % (ref 0.0–7.0)
Eosinophils Absolute: 0.1 10*3/uL (ref 0.0–0.5)
HEMATOCRIT: 38.9 % (ref 34.8–46.6)
HGB: 12.7 g/dL (ref 11.6–15.9)
LYMPH#: 1 10*3/uL (ref 0.9–3.3)
LYMPH%: 8 % — ABNORMAL LOW (ref 14.0–49.7)
MCH: 29.4 pg (ref 25.1–34.0)
MCHC: 32.7 g/dL (ref 31.5–36.0)
MCV: 90.2 fL (ref 79.5–101.0)
MONO#: 0.1 10*3/uL (ref 0.1–0.9)
MONO%: 1 % (ref 0.0–14.0)
NEUT#: 11 10*3/uL — ABNORMAL HIGH (ref 1.5–6.5)
NEUT%: 89.9 % — AB (ref 38.4–76.8)
Platelets: 130 10*3/uL — ABNORMAL LOW (ref 145–400)
RBC: 4.31 10*6/uL (ref 3.70–5.45)
RDW: 12.9 % (ref 11.2–14.5)
WBC: 12.2 10*3/uL — ABNORMAL HIGH (ref 3.9–10.3)

## 2016-06-30 LAB — COMPREHENSIVE METABOLIC PANEL
ALT: 21 U/L (ref 0–55)
AST: 20 U/L (ref 5–34)
Albumin: 3.4 g/dL — ABNORMAL LOW (ref 3.5–5.0)
Alkaline Phosphatase: 78 U/L (ref 40–150)
Anion Gap: 9 mEq/L (ref 3–11)
BILIRUBIN TOTAL: 1.15 mg/dL (ref 0.20–1.20)
BUN: 19 mg/dL (ref 7.0–26.0)
CO2: 29 meq/L (ref 22–29)
Calcium: 8.9 mg/dL (ref 8.4–10.4)
Chloride: 104 mEq/L (ref 98–109)
Creatinine: 0.8 mg/dL (ref 0.6–1.1)
EGFR: 80 mL/min/{1.73_m2} — AB (ref >90)
GLUCOSE: 92 mg/dL (ref 70–140)
Potassium: 4.2 mEq/L (ref 3.5–5.1)
SODIUM: 142 meq/L (ref 136–145)
TOTAL PROTEIN: 5.9 g/dL — AB (ref 6.4–8.3)

## 2016-06-30 MED ORDER — SODIUM CHLORIDE 0.9 % IJ SOLN
10.0000 mL | INTRAMUSCULAR | Status: DC | PRN
  Administered 2016-06-30: 10 mL via INTRAVENOUS
  Filled 2016-06-30: qty 10

## 2016-06-30 MED ORDER — SODIUM CHLORIDE 0.9 % IV SOLN
Freq: Once | INTRAVENOUS | Status: AC
  Administered 2016-06-30: 15:00:00 via INTRAVENOUS

## 2016-06-30 MED ORDER — HEPARIN SOD (PORK) LOCK FLUSH 100 UNIT/ML IV SOLN
500.0000 [IU] | Freq: Once | INTRAVENOUS | Status: AC | PRN
  Administered 2016-06-30: 500 [IU] via INTRAVENOUS
  Filled 2016-06-30: qty 5

## 2016-06-30 NOTE — Telephone Encounter (Signed)
  Oncology Nurse Navigator Documentation  Navigator Location: [facility] (06/30/16 1400) Navigator Encounter Type: Telephone (06/30/16 1400) Telephone: Incoming Call;Symptom Mgt (06/30/16 1400)                                        Time Spent with Patient: 15 (06/30/16 1400)   Received call from patient stating her blood pressure has been low since yesterday.  (84/54).  She states she has been feeling lightheaded but better this afternoon and her blood pressure to go up some.  She has been drinking lots of fluids and no nausea or vomiting per patient.  Patient will see Symptom Management today.

## 2016-06-30 NOTE — Patient Instructions (Signed)

## 2016-07-02 ENCOUNTER — Encounter: Payer: Self-pay | Admitting: Nurse Practitioner

## 2016-07-02 DIAGNOSIS — E860 Dehydration: Secondary | ICD-10-CM | POA: Insufficient documentation

## 2016-07-02 NOTE — Assessment & Plan Note (Signed)
Patient states that she's had decreased appetite since her first cycle of chemotherapy; and is feeling slightly dehydrated.  She also states that her blood pressure was as low as 80/54 earlier this morning; he should feel a little dizzy when she stood up quickly.  Patient states she has been trying to drink more fluids.  She denies any other new symptoms whatsoever.  She denies any dizziness at the current time.  Blood pressure on initial check at the cancer Center was 116/47.  Heart rate was 69.  Patient was afebrile.  Labs obtained today were also 2 within normal limits; with a mild thrombocytopenia with a platelet count of 130.  Patient will receive IV fluid rehydration while the cancer Center today.  She was also encouraged push fluids and was much as possible.  She was advised to call the [facility] or go directly to the emergency department for any symptomatic hypertension whatsoever.

## 2016-07-02 NOTE — Progress Notes (Signed)
SYMPTOM MANAGEMENT CLINIC    Chief Complaint: Dehydration  HPI:  Mercedes Nelson 65 y.o. female diagnosed with breast cancer.  Currently undergoing Taxotere/Cytoxan chemotherapy regimen.     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  All other systems reviewed and are negative.   Past Medical History  Diagnosis Date  . Thyroid disease   . Breast cancer ([place]) 04/20/16    Right breast invasive ductal ca  . Goiter   . Headache april 2017    "migraine and see starry thing" vision is affected, last 30 minutes  . PONV (postoperative nausea and vomiting)     "worse headache of my life", headache 12 hours after procedure  . History of urinary tract infection     Past Surgical History  Procedure Laterality Date  . Tumor removal  1985    cervix  . Breast lumpectomy with radioactive seed and sentinel lymph node biopsy Right 05/26/2016    Procedure: BREAST LUMPECTOMY WITH RADIOACTIVE SEED AND SENTINEL LYMPH NODE BIOPSY, INJECT BLUE DYE RIGHT BREAST;  Surgeon: Fanny Skates, MD;  Location: [facility];  Service: General;  Laterality: Right;  . Cervical ablation  1986    cervical lesion  . Portacath placement Left 06/26/2016    Procedure: INSERTION PORT-A-CATH WITH [place];  Surgeon: Fanny Skates, MD;  Location: [facility];  Service: General;   Laterality: Left;    has Malignant neoplasm of upper-outer quadrant of right female breast ([place]); Port catheter in place; and Dehydration on her problem list.    has No Known Allergies.    Medication List       This list is accurate as of: 06/30/16 11:59 PM.  Always use your most recent med list.               dexamethasone 4 MG tablet  Commonly known as:  DECADRON  Take 1 tablet (4 mg total) by mouth daily. Start the day before Taxotere. Then again the day after chemo X 1 day     HYDROcodone-acetaminophen 5-325 MG tablet  Commonly known as:  NORCO  Take 1-2 tablets by mouth every 6 (six) hours as needed.     lidocaine-prilocaine cream  Commonly known as:  EMLA  Apply to affected area once     multivitamin tablet  Take 1 tablet by mouth daily.     naproxen sodium 220 MG tablet  Commonly known as:  ANAPROX  Take 220 mg by mouth as needed (pain).     ondansetron 8 MG tablet  Commonly known as:  ZOFRAN  Take 1 tablet (8 mg total) by mouth 2 (two) times daily as needed for refractory nausea / vomiting. Start on day 3 after chemo.     prochlorperazine 10 MG tablet  Commonly known as:  COMPAZINE  Take 1 tablet (10 mg total) by mouth every 6 (six) hours  as needed (Nausea or vomiting).     simvastatin 40 MG tablet  Commonly known as:  ZOCOR  Take 20 mg by mouth daily.         PHYSICAL EXAMINATION  Oncology Vitals 06/30/2016 06/30/2016  Height - 173 cm  Weight - 79.561 kg  Weight (lbs) - 175 lbs 6 oz  BMI (kg/m2) - 26.67 kg/m2  Temp 98.1 98.9  Pulse 79 69  Resp 18 18  SpO2 96 99  BSA (m2) - 1.95 m2   BP Readings from Last 2 Encounters:  06/30/16 104/55  06/30/16 116/47    Physical Exam  Constitutional: She is oriented to person, place, and time and well-developed, well-nourished, and in no distress.  HENT:  Head: Normocephalic and atraumatic.  Eyes: Conjunctivae and EOM are normal. Pupils are equal, round, and reactive to light. Right eye exhibits no  discharge. Left eye exhibits no discharge. No scleral icterus.  Neck: Normal range of motion.  Pulmonary/Chest: Effort normal. No respiratory distress.  Musculoskeletal: Normal range of motion. She exhibits no edema or tenderness.  Neurological: She is alert and oriented to person, place, and time. Gait normal.  Skin: Skin is warm and dry.  Psychiatric: Affect normal.  Nursing note and vitals reviewed.   LABORATORY DATA:. Appointment on 06/30/2016  Component Date Value Ref Range Status  . WBC 06/30/2016 12.2* 3.9 - 10.3 10e3/uL Final  . NEUT# 06/30/2016 11.0* 1.5 - 6.5 10e3/uL Final  . HGB 06/30/2016 12.7  11.6 - 15.9 g/dL Final  . HCT 06/30/2016 38.9  34.8 - 46.6 % Final  . Platelets 06/30/2016 130* 145 - 400 10e3/uL Final  . MCV 06/30/2016 90.2  79.5 - 101.0 fL Final  . MCH 06/30/2016 29.4  25.1 - 34.0 pg Final  . MCHC 06/30/2016 32.7  31.5 - 36.0 g/dL Final  . RBC 06/30/2016 4.31  3.70 - 5.45 10e6/uL Final  . RDW 06/30/2016 12.9  11.2 - 14.5 % Final  . lymph# 06/30/2016 1.0  0.9 - 3.3 10e3/uL Final  . MONO# 06/30/2016 0.1  0.1 - 0.9 10e3/uL Final  . Eosinophils Absolute 06/30/2016 0.1  0.0 - 0.5 10e3/uL Final  . Basophils Absolute 06/30/2016 0.0  0.0 - 0.1 10e3/uL Final  . NEUT% 06/30/2016 89.9* 38.4 - 76.8 % Final  . LYMPH% 06/30/2016 8.0* 14.0 - 49.7 % Final  . MONO% 06/30/2016 1.0  0.0 - 14.0 % Final  . EOS% 06/30/2016 0.8  0.0 - 7.0 % Final  . BASO% 06/30/2016 0.3  0.0 - 2.0 % Final  . Sodium 06/30/2016 142  136 - 145 mEq/L Final  . Potassium 06/30/2016 4.2  3.5 - 5.1 mEq/L Final  . Chloride 06/30/2016 104  98 - 109 mEq/L Final  . CO2 06/30/2016 29  22 - 29 mEq/L Final  . Glucose 06/30/2016 92  70 - 140 mg/dl Final   Glucose reference range is for nonfasting patients. Fasting glucose reference range is 70- 100.  Marland Kitchen BUN 06/30/2016 19.0  7.0 - 26.0 mg/dL Final  . Creatinine 06/30/2016 0.8  0.6 - 1.1 mg/dL Final  . Total Bilirubin 06/30/2016 1.15  0.20 - 1.20 mg/dL Final    . Alkaline Phosphatase 06/30/2016 78  40 - 150 U/L Final  . AST 06/30/2016 20  5 - 34 U/L Final  . ALT 06/30/2016 21  0 - 55 U/L Final  . Total Protein 06/30/2016 5.9* 6.4 - 8.3 g/dL Final  . Albumin 06/30/2016 3.4* 3.5 - 5.0 g/dL Final  . Calcium  06/30/2016 8.9  8.4 - 10.4 mg/dL Final  . Anion Gap 06/30/2016 9  3 - 11 mEq/L Final  . EGFR 06/30/2016 80* >90 ml/min/1.73 m2 Final   eGFR is calculated using the CKD-EPI Creatinine Equation (2009)    RADIOGRAPHIC STUDIES: No results found.  ASSESSMENT/PLAN:    Malignant neoplasm of upper-outer quadrant of right female breast [facility]) Patient received cycle one of her Taxotere/Cytoxan chemotherapy on 06/27/2016.  She also received Neulasta for growth factor support.  Patient states that she experienced some bone pain following her Neulasta injection; but it has essentially resolved at this point.  Labs obtained today were all essentially within normal limits; with mild pancytopenia with platelet count 130.  Patient is scheduled to return for labs and a follow-up visit on 07/04/2016.  Dehydration Patient states that she's had decreased appetite since her first cycle of chemotherapy; and is feeling slightly dehydrated.  She also states that her blood pressure was as low as 80/54 earlier this morning; he should feel a little dizzy when she stood up quickly.  Patient states she has been trying to drink more fluids.  She denies any other new symptoms whatsoever.  She denies any dizziness at the current time.  Blood pressure on initial check at the cancer Center was 116/47.  Heart rate was 69.  Patient was afebrile.  Labs obtained today were also 2 within normal limits; with a mild thrombocytopenia with a platelet count of 130.  Patient will receive IV fluid rehydration while the cancer Center today.  She was also encouraged push fluids and was much as possible.  She was advised to call the [facility] or go directly to the emergency  department for any symptomatic hypertension whatsoever.   Patient stated understanding of all instructions; and was in agreement with this plan of care. The patient knows to call the clinic with any problems, questions or concerns.   Total time spent with patient was 25 minutes;  with greater than 75 percent of that time spent in face to face counseling regarding patient's symptoms,  and coordination of care and follow up.  Disclaimer:This dictation was prepared with Dragon/digital dictation along with [employer]. Any transcriptional errors that result from this process are unintentional.  Drue Second, NP 07/02/2016

## 2016-07-02 NOTE — Assessment & Plan Note (Signed)
Patient received cycle one of her Taxotere/Cytoxan chemotherapy on 06/27/2016.  She also received Neulasta for growth factor support.  Patient states that she experienced some bone pain following her Neulasta injection; but it has essentially resolved at this point.  Labs obtained today were all essentially within normal limits; with mild pancytopenia with platelet count 130.  Patient is scheduled to return for labs and a follow-up visit on 07/04/2016.

## 2016-07-04 ENCOUNTER — Ambulatory Visit (HOSPITAL_BASED_OUTPATIENT_CLINIC_OR_DEPARTMENT_OTHER): Payer: PPO | Admitting: Hematology and Oncology

## 2016-07-04 ENCOUNTER — Other Ambulatory Visit (HOSPITAL_BASED_OUTPATIENT_CLINIC_OR_DEPARTMENT_OTHER): Payer: PPO

## 2016-07-04 ENCOUNTER — Encounter: Payer: Self-pay | Admitting: Hematology and Oncology

## 2016-07-04 ENCOUNTER — Encounter: Payer: Self-pay | Admitting: *Deleted

## 2016-07-04 VITALS — BP 114/64 | HR 76 | Temp 98.4°F | Resp 17 | Wt 172.6 lb

## 2016-07-04 DIAGNOSIS — R197 Diarrhea, unspecified: Secondary | ICD-10-CM | POA: Diagnosis not present

## 2016-07-04 DIAGNOSIS — C50411 Malignant neoplasm of upper-outer quadrant of right female breast: Secondary | ICD-10-CM

## 2016-07-04 DIAGNOSIS — E860 Dehydration: Secondary | ICD-10-CM

## 2016-07-04 DIAGNOSIS — R509 Fever, unspecified: Secondary | ICD-10-CM

## 2016-07-04 DIAGNOSIS — E46 Unspecified protein-calorie malnutrition: Secondary | ICD-10-CM | POA: Diagnosis not present

## 2016-07-04 DIAGNOSIS — L658 Other specified nonscarring hair loss: Secondary | ICD-10-CM

## 2016-07-04 LAB — URINALYSIS, MICROSCOPIC - CHCC
BLOOD: NEGATIVE
Bilirubin (Urine): NEGATIVE
GLUCOSE UR CHCC: NEGATIVE mg/dL
KETONES: NEGATIVE mg/dL
Leukocyte Esterase: NEGATIVE
Nitrite: NEGATIVE
Protein: NEGATIVE mg/dL
SPECIFIC GRAVITY, URINE: 1.005 (ref 1.003–1.035)
UROBILINOGEN UR: 0.2 mg/dL (ref 0.2–1)
pH: 6 (ref 4.6–8.0)

## 2016-07-04 LAB — COMPREHENSIVE METABOLIC PANEL
ALT: 27 U/L (ref 0–55)
AST: 22 U/L (ref 5–34)
Albumin: 3.4 g/dL — ABNORMAL LOW (ref 3.5–5.0)
Alkaline Phosphatase: 87 U/L (ref 40–150)
Anion Gap: 10 mEq/L (ref 3–11)
BILIRUBIN TOTAL: 0.9 mg/dL (ref 0.20–1.20)
BUN: 14 mg/dL (ref 7.0–26.0)
CO2: 25 meq/L (ref 22–29)
CREATININE: 0.7 mg/dL (ref 0.6–1.1)
Calcium: 9.3 mg/dL (ref 8.4–10.4)
Chloride: 104 mEq/L (ref 98–109)
EGFR: 86 mL/min/{1.73_m2} — AB (ref >90)
GLUCOSE: 119 mg/dL (ref 70–140)
Potassium: 3.9 mEq/L (ref 3.5–5.1)
Sodium: 139 mEq/L (ref 136–145)
TOTAL PROTEIN: 6.4 g/dL (ref 6.4–8.3)

## 2016-07-04 LAB — CBC WITH DIFFERENTIAL/PLATELET
BASO%: 1 % (ref 0.0–2.0)
BASOS ABS: 0 10*3/uL (ref 0.0–0.1)
EOS%: 3 % (ref 0.0–7.0)
Eosinophils Absolute: 0.1 10*3/uL (ref 0.0–0.5)
HCT: 40.8 % (ref 34.8–46.6)
HGB: 13.3 g/dL (ref 11.6–15.9)
LYMPH#: 1 10*3/uL (ref 0.9–3.3)
LYMPH%: 34.7 % (ref 14.0–49.7)
MCH: 29.4 pg (ref 25.1–34.0)
MCHC: 32.7 g/dL (ref 31.5–36.0)
MCV: 89.8 fL (ref 79.5–101.0)
MONO#: 0.9 10*3/uL (ref 0.1–0.9)
MONO%: 30.6 % — ABNORMAL HIGH (ref 0.0–14.0)
NEUT#: 0.9 10*3/uL — ABNORMAL LOW (ref 1.5–6.5)
NEUT%: 30.7 % — ABNORMAL LOW (ref 38.4–76.8)
Platelets: 139 10*3/uL — ABNORMAL LOW (ref 145–400)
RBC: 4.54 10*6/uL (ref 3.70–5.45)
RDW: 12.4 % (ref 11.2–14.5)
WBC: 2.9 10*3/uL — ABNORMAL LOW (ref 3.9–10.3)

## 2016-07-04 NOTE — Assessment & Plan Note (Signed)
Right lumpectomy 05/26/2016: IDC with DCIS 1.6 cm, margins, 0/2 lymph nodes negative, ER 100%, PR 20%, Ki-67 15%, HER-2 negative ratio 1.0 T1 BN 0 stage IA pathologic stage Oncotype DX 26, 17% risk of recurrence  Recommendation: 1. Adjuvant chemotherapy with Taxotere and Cytoxan followed by 2. Adjuvant radiation therapy followed by 3. Adjuvant antiestrogen therapy ------------------------------------------------------------------------------------------------ Current treatment: Cycle 1 day 8 Taxotere and Cytoxan  Chemotoxicities: 1. Dehydration requiring IV fluids 2. Decreased oral intake 3. Loss of taste and appetite 4. Alopecia   Return to clinic in 2 weeks for cycle 2

## 2016-07-04 NOTE — Progress Notes (Signed)
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[place])   04/05/2016 Mammogram Asymmetry in the right breast lateral middle depth, 11 mm non-circumscribed mass ([facility] mammogram)   04/05/2016 Initial Diagnosis Right breast biopsy: Invasive ductal carcinoma ER 90-100%, PR 20%, Ki-67 20%, HER-2 Neg, T1b N0 stage IA clinical stage   05/26/2016 Surgery Right lumpectomy: IDC with DCIS 1.6 and goes, margins, 0/2 lymph nodes negative, ER 100%, PR 20%, Ki-67 15%, HER-2 negative ratio 1.0 T1 BN 0 stage IA pathologic stage, Oncotype DX 26, 17% ROR   06/27/2016 -  Chemotherapy Adjuvant chemotherapy with Taxotere and Cytoxan 4    CHIEF COMPLIANT: Cycle 1 day 8 Taxotere and Cytoxan  INTERVAL HISTORY: Mercedes Nelson is a 65 year old with above-mentioned history of right breast cancer currently on adjuvant chemotherapy with Taxotere and Cytoxan. She got profound diarrhea that started the day after chemotherapy and she required IV fluids a few days ago. She is here for a toxicity check and need for count check. She reports that the diarrhea has persisted every day of the week 4-5 times a day. She also had a low-grade temperature of 100 yesterday which got better with Tylenol. This morning she had no fevers or chills. She has some chest tightness but denies any shortness of breath or cough expectoration.  REVIEW OF SYSTEMS:   Constitutional: Denies fevers, chills or abnormal weight loss Eyes: Denies blurriness of vision Ears, nose, mouth, throat, and face: Denies mucositis or sore throat Respiratory: Denies cough, dyspnea or wheezes Cardiovascular: Denies palpitation, chest discomfort Gastrointestinal: Diarrhea Skin: Denies abnormal skin rashes Lymphatics: Denies new lymphadenopathy or easy bruising Neurological:Denies numbness, tingling or new weaknesses Behavioral/Psych: Mood is stable, no new changes    Extremities: No lower extremity edema  All other systems were reviewed with the patient and are negative.  I have reviewed the past medical history, past surgical history, social history and family history with the patient and they are unchanged from previous note.  ALLERGIES:  has No Known Allergies.  MEDICATIONS:  Current Outpatient Prescriptions  Medication Sig Dispense Refill  . dexamethasone (DECADRON) 4 MG tablet Take 1 tablet (4 mg total) by mouth daily. Start the day before Taxotere. Then again the day after chemo X 1 day 15 tablet 0  . HYDROcodone-acetaminophen (NORCO) 5-325 MG tablet Take 1-2 tablets by mouth every 6 (six) hours as needed. 30 tablet 0  . lidocaine-prilocaine (EMLA) cream Apply to affected area once 30 g 3  . Multiple Vitamin (MULTIVITAMIN) tablet Take 1 tablet by mouth daily.    . naproxen sodium (ANAPROX) 220 MG tablet Take 220 mg by mouth as needed (pain).     . ondansetron (ZOFRAN) 8 MG tablet Take 1 tablet (8 mg total) by mouth 2 (two) times daily as needed for refractory nausea / vomiting. Start on day 3 after chemo. 30 tablet 1  . prochlorperazine (COMPAZINE) 10 MG tablet Take 1 tablet (10 mg total) by mouth every 6 (six) hours as needed (Nausea or vomiting). 30 tablet 1  . simvastatin (ZOCOR) 40 MG tablet Take 20 mg by mouth daily.      No current facility-administered medications for this visit.    PHYSICAL EXAMINATION: ECOG PERFORMANCE STATUS: 2 - Symptomatic, <50% confined to bed  Filed Vitals:   07/04/16 0930  BP: 114/64  Pulse: 76  Temp: 98.4 F (36.9 C)  Resp: 17  Filed Weights   07/04/16 0930  Weight: 172 lb 9 oz (78.274 kg)    GENERAL:alert, no distress and comfortable SKIN: skin color, texture, turgor are normal, no rashes or significant lesions EYES: normal, Conjunctiva are pink and non-injected, sclera clear OROPHARYNX:no exudate, no erythema and lips, buccal mucosa, and tongue normal  NECK: supple, thyroid normal size,  non-tender, without nodularity LYMPH:  no palpable lymphadenopathy in the cervical, axillary or inguinal LUNGS: clear to auscultation and percussion with normal breathing effort HEART: regular rate & rhythm and no murmurs and no lower extremity edema ABDOMEN:abdomen soft, non-tender and normal bowel sounds MUSCULOSKELETAL:no cyanosis of digits and no clubbing  NEURO: alert & oriented x 3 with fluent speech, no focal motor/sensory deficits EXTREMITIES: No lower extremity edema   LABORATORY DATA:  I have reviewed the data as listed   Chemistry      Component Value Date/Time   NA 139 07/04/2016 0918   NA 140 06/23/2016 1530   K 3.9 07/04/2016 0918   K 4.6 06/23/2016 1530   CL 106 06/23/2016 1530   CO2 25 07/04/2016 0918   CO2 30 06/23/2016 1530   BUN 14.0 07/04/2016 0918   BUN 17 06/23/2016 1530   CREATININE 0.7 07/04/2016 0918   CREATININE 0.72 06/23/2016 1530      Component Value Date/Time   CALCIUM 9.3 07/04/2016 0918   CALCIUM 8.9 06/23/2016 1530   ALKPHOS 87 07/04/2016 0918   ALKPHOS 59 05/24/2016 1305   AST 22 07/04/2016 0918   AST 22 05/24/2016 1305   ALT 27 07/04/2016 0918   ALT 24 05/24/2016 1305   BILITOT 0.90 07/04/2016 0918   BILITOT 1.2 05/24/2016 1305       Lab Results  Component Value Date   WBC 2.9* 07/04/2016   HGB 13.3 07/04/2016   HCT 40.8 07/04/2016   MCV 89.8 07/04/2016   PLT 139* 07/04/2016   NEUTROABS 0.9* 07/04/2016     ASSESSMENT & PLAN:  Malignant neoplasm of upper-outer quadrant of right female breast ([place]) Right lumpectomy 05/26/2016: IDC with DCIS 1.6 cm, margins, 0/2 lymph nodes negative, ER 100%, PR 20%, Ki-67 15%, HER-2 negative ratio 1.0 T1 BN 0 stage IA pathologic stage Oncotype DX 26, 17% risk of recurrence  Recommendation: 1. Adjuvant chemotherapy with Taxotere and Cytoxan followed by 2. Adjuvant radiation therapy followed by 3. Adjuvant antiestrogen  therapy ------------------------------------------------------------------------------------------------ Current treatment: Cycle 1 day 8 Taxotere and Cytoxan  Chemotoxicities: 1. Dehydration requiring IV fluids 2. Decreased oral intake 3. Loss of taste and appetite 4. Alopecia  5. Diarrhea: I gave her instructions on how to take Imodium. 6. Low-grade fever: We will obtain a UA C&S today. She has no respiratory complaints. I instructed her to call as the temperature goes above 100.5 as she might need antibiotics. Her ANC today is 900. I recommended decreasing the dosage of cycle 2 of chemotherapy because of the side effects.  Return to clinic in 2 weeks for cycle 2   No orders of the defined types were placed in this encounter.   The patient has a good understanding of the overall plan. she agrees with it. she will call with any problems that may develop before the next visit here.   Rulon Eisenmenger, MD 07/04/2016

## 2016-07-05 LAB — URINE CULTURE: Organism ID, Bacteria: NO GROWTH

## 2016-07-18 ENCOUNTER — Ambulatory Visit (HOSPITAL_BASED_OUTPATIENT_CLINIC_OR_DEPARTMENT_OTHER): Payer: PPO

## 2016-07-18 ENCOUNTER — Encounter: Payer: Self-pay | Admitting: Hematology and Oncology

## 2016-07-18 ENCOUNTER — Encounter: Payer: Self-pay | Admitting: *Deleted

## 2016-07-18 ENCOUNTER — Ambulatory Visit (HOSPITAL_BASED_OUTPATIENT_CLINIC_OR_DEPARTMENT_OTHER): Payer: PPO | Admitting: Hematology and Oncology

## 2016-07-18 ENCOUNTER — Other Ambulatory Visit (HOSPITAL_BASED_OUTPATIENT_CLINIC_OR_DEPARTMENT_OTHER): Payer: PPO

## 2016-07-18 DIAGNOSIS — E46 Unspecified protein-calorie malnutrition: Secondary | ICD-10-CM

## 2016-07-18 DIAGNOSIS — C50411 Malignant neoplasm of upper-outer quadrant of right female breast: Secondary | ICD-10-CM

## 2016-07-18 DIAGNOSIS — Z5189 Encounter for other specified aftercare: Secondary | ICD-10-CM

## 2016-07-18 DIAGNOSIS — E860 Dehydration: Secondary | ICD-10-CM

## 2016-07-18 DIAGNOSIS — L658 Other specified nonscarring hair loss: Secondary | ICD-10-CM

## 2016-07-18 DIAGNOSIS — Z5111 Encounter for antineoplastic chemotherapy: Secondary | ICD-10-CM | POA: Diagnosis not present

## 2016-07-18 DIAGNOSIS — Z170 Estrogen receptor positive status [ER+]: Secondary | ICD-10-CM | POA: Diagnosis not present

## 2016-07-18 DIAGNOSIS — R197 Diarrhea, unspecified: Secondary | ICD-10-CM

## 2016-07-18 LAB — CBC WITH DIFFERENTIAL/PLATELET
BASO%: 0.4 % (ref 0.0–2.0)
Basophils Absolute: 0 10*3/uL (ref 0.0–0.1)
EOS ABS: 0 10*3/uL (ref 0.0–0.5)
EOS%: 0 % (ref 0.0–7.0)
HCT: 40.1 % (ref 34.8–46.6)
HEMOGLOBIN: 13.2 g/dL (ref 11.6–15.9)
LYMPH#: 0.7 10*3/uL — AB (ref 0.9–3.3)
LYMPH%: 6.9 % — ABNORMAL LOW (ref 14.0–49.7)
MCH: 29.8 pg (ref 25.1–34.0)
MCHC: 33 g/dL (ref 31.5–36.0)
MCV: 90.3 fL (ref 79.5–101.0)
MONO#: 0.3 10*3/uL (ref 0.1–0.9)
MONO%: 2.8 % (ref 0.0–14.0)
NEUT%: 89.9 % — ABNORMAL HIGH (ref 38.4–76.8)
NEUTROS ABS: 9.1 10*3/uL — AB (ref 1.5–6.5)
Platelets: 376 10*3/uL (ref 145–400)
RBC: 4.44 10*6/uL (ref 3.70–5.45)
RDW: 13.1 % (ref 11.2–14.5)
WBC: 10.1 10*3/uL (ref 3.9–10.3)

## 2016-07-18 LAB — COMPREHENSIVE METABOLIC PANEL
ALBUMIN: 3.7 g/dL (ref 3.5–5.0)
ALK PHOS: 96 U/L (ref 40–150)
ALT: 23 U/L (ref 0–55)
AST: 16 U/L (ref 5–34)
Anion Gap: 9 mEq/L (ref 3–11)
BILIRUBIN TOTAL: 0.89 mg/dL (ref 0.20–1.20)
BUN: 18.8 mg/dL (ref 7.0–26.0)
CO2: 26 mEq/L (ref 22–29)
CREATININE: 0.7 mg/dL (ref 0.6–1.1)
Calcium: 9.6 mg/dL (ref 8.4–10.4)
Chloride: 108 mEq/L (ref 98–109)
EGFR: 86 mL/min/{1.73_m2} — ABNORMAL LOW (ref >90)
GLUCOSE: 115 mg/dL (ref 70–140)
Potassium: 4.5 mEq/L (ref 3.5–5.1)
SODIUM: 142 meq/L (ref 136–145)
TOTAL PROTEIN: 6.6 g/dL (ref 6.4–8.3)

## 2016-07-18 MED ORDER — SODIUM CHLORIDE 0.9 % IV SOLN
500.0000 mg/m2 | Freq: Once | INTRAVENOUS | Status: AC
  Administered 2016-07-18: 980 mg via INTRAVENOUS
  Filled 2016-07-18: qty 49

## 2016-07-18 MED ORDER — PEGFILGRASTIM 6 MG/0.6ML ~~LOC~~ PSKT
6.0000 mg | PREFILLED_SYRINGE | Freq: Once | SUBCUTANEOUS | Status: AC
  Administered 2016-07-18: 6 mg via SUBCUTANEOUS
  Filled 2016-07-18: qty 0.6

## 2016-07-18 MED ORDER — HEPARIN SOD (PORK) LOCK FLUSH 100 UNIT/ML IV SOLN
500.0000 [IU] | Freq: Once | INTRAVENOUS | Status: AC | PRN
  Administered 2016-07-18: 500 [IU]
  Filled 2016-07-18: qty 5

## 2016-07-18 MED ORDER — PALONOSETRON HCL INJECTION 0.25 MG/5ML
INTRAVENOUS | Status: AC
  Filled 2016-07-18: qty 5

## 2016-07-18 MED ORDER — PALONOSETRON HCL INJECTION 0.25 MG/5ML
0.2500 mg | Freq: Once | INTRAVENOUS | Status: AC
  Administered 2016-07-18: 0.25 mg via INTRAVENOUS

## 2016-07-18 MED ORDER — SODIUM CHLORIDE 0.9 % IV SOLN
Freq: Once | INTRAVENOUS | Status: AC
  Administered 2016-07-18: 10:00:00 via INTRAVENOUS

## 2016-07-18 MED ORDER — SODIUM CHLORIDE 0.9% FLUSH
10.0000 mL | INTRAVENOUS | Status: DC | PRN
  Administered 2016-07-18: 10 mL
  Filled 2016-07-18: qty 10

## 2016-07-18 MED ORDER — SODIUM CHLORIDE 0.9 % IV SOLN
65.0000 mg/m2 | Freq: Once | INTRAVENOUS | Status: AC
  Administered 2016-07-18: 130 mg via INTRAVENOUS
  Filled 2016-07-18: qty 13

## 2016-07-18 MED ORDER — SODIUM CHLORIDE 0.9 % IV SOLN
10.0000 mg | Freq: Once | INTRAVENOUS | Status: AC
  Administered 2016-07-18: 10 mg via INTRAVENOUS
  Filled 2016-07-18: qty 1

## 2016-07-18 NOTE — Progress Notes (Signed)
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[place])   04/05/2016 Mammogram    Asymmetry in the right breast lateral middle depth, 11 mm non-circumscribed mass ([facility] mammogram)     04/05/2016 Initial Diagnosis    Right breast biopsy: Invasive ductal carcinoma ER 90-100%, PR 20%, Ki-67 20%, HER-2 Neg, T1b N0 stage IA clinical stage     05/26/2016 Surgery    Right lumpectomy: IDC with DCIS 1.6 and goes, margins, 0/2 lymph nodes negative, ER 100%, PR 20%, Ki-67 15%, HER-2 negative ratio 1.0 T1 BN 0 stage IA pathologic stage, Oncotype DX 26, 17% ROR     06/27/2016 -  Chemotherapy    Adjuvant chemotherapy with Taxotere and Cytoxan 4      CHIEF COMPLIANT: Cycle 2 Taxotere Cytoxan  INTERVAL HISTORY: Mercedes Nelson is a 65 year old with above-mentioned history of right breast cancer who is here today for cycle 2 of adjuvant chemotherapy with Taxotere and Cytoxan. After cycle one she had profound diarrhea and dehydration and fatigue with loss of taste and appetite. Almost all the symptoms have resolved. Taste has come back. Does not have any nausea or vomiting. Energy levels are nearly normal. No further fevers. She did have low-grade temperatures for 2-3 days during the count recovery phase.  REVIEW OF SYSTEMS:   Constitutional: Denies fevers, chills or abnormal weight loss Eyes: Denies blurriness of vision Ears, nose, mouth, throat, and face: Denies mucositis or sore throat Respiratory: Denies cough, dyspnea or wheezes Cardiovascular: Denies palpitation, chest discomfort Gastrointestinal:  Diarrhea subsided Skin: Denies abnormal skin rashes Lymphatics: Denies new lymphadenopathy or easy bruising Neurological:Denies numbness, tingling or new weaknesses Behavioral/Psych: Mood is stable, no new changes  Extremities: No lower extremity edema  All other systems were reviewed with the  patient and are negative.  I have reviewed the past medical history, past surgical history, social history and family history with the patient and they are unchanged from previous note.  ALLERGIES:  has No Known Allergies.  MEDICATIONS:  Current Outpatient Prescriptions  Medication Sig Dispense Refill  . dexamethasone (DECADRON) 4 MG tablet Take 1 tablet (4 mg total) by mouth daily. Start the day before Taxotere. Then again the day after chemo X 1 day 15 tablet 0  . HYDROcodone-acetaminophen (NORCO) 5-325 MG tablet Take 1-2 tablets by mouth every 6 (six) hours as needed. 30 tablet 0  . lidocaine-prilocaine (EMLA) cream Apply to affected area once 30 g 3  . Multiple Vitamin (MULTIVITAMIN) tablet Take 1 tablet by mouth daily.    . naproxen sodium (ANAPROX) 220 MG tablet Take 220 mg by mouth as needed (pain).     . ondansetron (ZOFRAN) 8 MG tablet Take 1 tablet (8 mg total) by mouth 2 (two) times daily as needed for refractory nausea / vomiting. Start on day 3 after chemo. 30 tablet 1  . prochlorperazine (COMPAZINE) 10 MG tablet Take 1 tablet (10 mg total) by mouth every 6 (six) hours as needed (Nausea or vomiting). 30 tablet 1  . simvastatin (ZOCOR) 40 MG tablet Take 20 mg by mouth daily.      No current facility-administered medications for this visit.     PHYSICAL EXAMINATION: ECOG PERFORMANCE STATUS: 1 - Symptomatic but completely ambulatory  Vitals:   07/18/16 0825  BP: 127/60  Pulse: 65  Resp: 18  Temp: 98.3 F (36.8 C)   Filed Weights  07/18/16 0825  Weight: 176 lb (79.8 kg)    GENERAL:alert, no distress and comfortable SKIN: skin color, texture, turgor are normal, no rashes or significant lesions EYES: normal, Conjunctiva are pink and non-injected, sclera clear OROPHARYNX:no exudate, no erythema and lips, buccal mucosa, and tongue normal  NECK: supple, thyroid normal size, non-tender, without nodularity LYMPH:  no palpable lymphadenopathy in the cervical, axillary or  inguinal LUNGS: clear to auscultation and percussion with normal breathing effort HEART: regular rate & rhythm and no murmurs and no lower extremity edema ABDOMEN:abdomen soft, non-tender and normal bowel sounds MUSCULOSKELETAL:no cyanosis of digits and no clubbing  NEURO: alert & oriented x 3 with fluent speech, no focal motor/sensory deficits EXTREMITIES: No lower extremity edema  LABORATORY DATA:  I have reviewed the data as listed   Chemistry      Component Value Date/Time   NA 139 07/04/2016 0918   K 3.9 07/04/2016 0918   CL 106 06/23/2016 1530   CO2 25 07/04/2016 0918   BUN 14.0 07/04/2016 0918   CREATININE 0.7 07/04/2016 0918      Component Value Date/Time   CALCIUM 9.3 07/04/2016 0918   ALKPHOS 87 07/04/2016 0918   AST 22 07/04/2016 0918   ALT 27 07/04/2016 0918   BILITOT 0.90 07/04/2016 0918       Lab Results  Component Value Date   WBC 10.1 07/18/2016   HGB 13.2 07/18/2016   HCT 40.1 07/18/2016   MCV 90.3 07/18/2016   PLT 376 07/18/2016   NEUTROABS 9.1 (H) 07/18/2016     ASSESSMENT & PLAN:  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 Adjuvant chemotherapy with Taxotere and Cytoxan X 4 followed by 2. Adjuvant radiation therapy followed by 3. Adjuvant antiestrogen therapy ------------------------------------------------------------------------------------------------ Current treatment: Cycle 2 day 1 Taxotere and Cytoxan  Chemotoxicities: 1. Dehydration requiring IV fluids 2. Decreased oral intake 3. Loss of taste and appetite 4. Alopecia  5. Diarrhea: Improved with Imodium. 6. Low-grade fever: No evidence of infection. Reduced the dosage of cycle 2 of chemotherapy  Return to clinic in 3  weeks for cycle 3   No orders of the defined types were placed in this encounter.  The patient has a good understanding of the overall plan. she agrees with it. she will call with any problems that may develop before the next visit here.   Rulon Eisenmenger, MD 07/18/16

## 2016-07-18 NOTE — Patient Instructions (Signed)
[facility] Discharge Instructions for Patients Receiving Chemotherapy  Today you received the following chemotherapy agents taxotere/cytoxan  To help prevent nausea and vomiting after your treatment, we encourage you to take your nausea medication as directed DO NOT TAKE ZOFRAN FOR 3 DAYS FOLLOWING CHEMOTHERAPY.     If you develop nausea and vomiting that is not controlled by your nausea medication, call the clinic.   BELOW ARE SYMPTOMS THAT SHOULD BE REPORTED IMMEDIATELY:  *FEVER GREATER THAN 100.5 F  *CHILLS WITH OR WITHOUT FEVER  NAUSEA AND VOMITING THAT IS NOT CONTROLLED WITH YOUR NAUSEA MEDICATION  *UNUSUAL SHORTNESS OF BREATH  *UNUSUAL BRUISING OR BLEEDING  TENDERNESS IN MOUTH AND THROAT WITH OR WITHOUT PRESENCE OF ULCERS  *URINARY PROBLEMS  *BOWEL PROBLEMS  UNUSUAL RASH Items with * indicate a potential emergency and should be followed up as soon as possible.  Feel free to call the clinic you have any questions or concerns. The clinic phone number is [phone].  Docetaxel injection What is this medicine? DOCETAXEL (doe se TAX el) is a chemotherapy drug. It targets fast dividing cells, like cancer cells, and causes these cells to die. This medicine is used to treat many types of cancers like breast cancer, certain stomach cancers, head and neck cancer, lung cancer, and prostate cancer. This medicine may be used for other purposes; ask your health care provider or pharmacist if you have questions. What should I tell my health care provider before I take this medicine? They need to know if you have any of these conditions: -infection (especially a virus infection such as chickenpox, cold sores, or herpes) -liver disease -low blood counts, like low white cell, platelet, or red cell counts -an unusual or allergic reaction to docetaxel, polysorbate 80, other chemotherapy agents, other medicines, foods, dyes, or preservatives -pregnant or trying to get  pregnant -breast-feeding How should I use this medicine? This drug is given as an infusion into a vein. It is administered in a hospital or clinic by a specially trained health care professional. Talk to your pediatrician regarding the use of this medicine in children. Special care may be needed. Overdosage: If you think you have taken too much of this medicine contact a poison control center or emergency room at once. NOTE: This medicine is only for you. Do not share this medicine with others. What if I miss a dose? It is important not to miss your dose. Call your doctor or health care professional if you are unable to keep an appointment. What may interact with this medicine? -cyclosporine -erythromycin -ketoconazole -medicines to increase blood counts like filgrastim, pegfilgrastim, sargramostim -vaccines Talk to your doctor or health care professional before taking any of these medicines: -acetaminophen -aspirin -ibuprofen -ketoprofen -naproxen This list may not describe all possible interactions. Give your health care provider a list of all the medicines, herbs, non-prescription drugs, or dietary supplements you use. Also tell them if you smoke, drink alcohol, or use illegal drugs. Some items may interact with your medicine. What should I watch for while using this medicine? Your condition will be monitored carefully while you are receiving this medicine. You will need important blood work done while you are taking this medicine. This drug may make you feel generally unwell. This is not uncommon, as chemotherapy can affect healthy cells as well as cancer cells. Report any side effects. Continue your course of treatment even though you feel ill unless your doctor tells you to stop. In some cases, you may  be given additional medicines to help with side effects. Follow all directions for their use. Call your doctor or health care professional for advice if you get a fever, chills or sore  throat, or other symptoms of a cold or flu. Do not treat yourself. This drug decreases your body's ability to fight infections. Try to avoid being around people who are sick. This medicine may increase your risk to bruise or bleed. Call your doctor or health care professional if you notice any unusual bleeding. This medicine may contain alcohol in the product. You may get drowsy or dizzy. Do not drive, use machinery, or do anything that needs mental alertness until you know how this medicine affects you. Do not stand or sit up quickly, especially if you are an older patient. This reduces the risk of dizzy or fainting spells. Avoid alcoholic drinks. Do not become pregnant while taking this medicine. Women should inform their doctor if they wish to become pregnant or think they might be pregnant. There is a potential for serious side effects to an unborn child. Talk to your health care professional or pharmacist for more information. Do not breast-feed an infant while taking this medicine. What side effects may I notice from receiving this medicine? Side effects that you should report to your doctor or health care professional as soon as possible: -allergic reactions like skin rash, itching or hives, swelling of the face, lips, or tongue -low blood counts - This drug may decrease the number of white blood cells, red blood cells and platelets. You may be at increased risk for infections and bleeding. -signs of infection - fever or chills, cough, sore throat, pain or difficulty passing urine -signs of decreased platelets or bleeding - bruising, pinpoint red spots on the skin, black, tarry stools, nosebleeds -signs of decreased red blood cells - unusually weak or tired, fainting spells, lightheadedness -breathing problems -fast or irregular heartbeat -low blood pressure -mouth sores -nausea and vomiting -pain, swelling, redness or irritation at the injection site -pain, tingling, numbness in the hands or  feet -swelling of the ankle, feet, hands -weight gain Side effects that usually do not require medical attention (report to your prescriber or health care professional if they continue or are bothersome): -bone pain -complete hair loss including hair on your head, underarms, pubic hair, eyebrows, and eyelashes -diarrhea -excessive tearing -changes in the color of fingernails -loosening of the fingernails -nausea -muscle pain -red flush to skin -sweating -weak or tired This list may not describe all possible side effects. Call your doctor for medical advice about side effects. You may report side effects to FDA at 1-800-FDA-1088. Where should I keep my medicine? This drug is given in a hospital or clinic and will not be stored at home. NOTE: This sheet is a summary. It may not cover all possible information. If you have questions about this medicine, talk to your doctor, pharmacist, or health care provider.    2016, Elsevier/Gold Standard. (2014-12-21 16:04:57)  Cyclophosphamide injection What is this medicine? CYCLOPHOSPHAMIDE (sye kloe FOSS fa mide) is a chemotherapy drug. It slows the growth of cancer cells. This medicine is used to treat many types of cancer like lymphoma, myeloma, leukemia, breast cancer, and ovarian cancer, to name a few. This medicine may be used for other purposes; ask your health care provider or pharmacist if you have questions. What should I tell my health care provider before I take this medicine? They need to know if you have any of these  conditions: -blood disorders -history of other chemotherapy -infection -kidney disease -liver disease -recent or ongoing radiation therapy -tumors in the bone marrow -an unusual or allergic reaction to cyclophosphamide, other chemotherapy, other medicines, foods, dyes, or preservatives -pregnant or trying to get pregnant -breast-feeding How should I use this medicine? This drug is usually given as an injection  into a vein or muscle or by infusion into a vein. It is administered in a hospital or clinic by a specially trained health care professional. Talk to your pediatrician regarding the use of this medicine in children. Special care may be needed. Overdosage: If you think you have taken too much of this medicine contact a poison control center or emergency room at once. NOTE: This medicine is only for you. Do not share this medicine with others. What if I miss a dose? It is important not to miss your dose. Call your doctor or health care professional if you are unable to keep an appointment. What may interact with this medicine? This medicine may interact with the following medications: -amiodarone -amphotericin B -azathioprine -certain antiviral medicines for HIV or AIDS such as protease inhibitors (e.g., indinavir, ritonavir) and zidovudine -certain blood pressure medications such as benazepril, captopril, enalapril, fosinopril, lisinopril, moexipril, monopril, perindopril, quinapril, ramipril, trandolapril -certain cancer medications such as anthracyclines (e.g., daunorubicin, doxorubicin), busulfan, cytarabine, paclitaxel, pentostatin, tamoxifen, trastuzumab -certain diuretics such as chlorothiazide, chlorthalidone, hydrochlorothiazide, indapamide, metolazone -certain medicines that treat or prevent blood clots like warfarin -certain muscle relaxants such as succinylcholine -cyclosporine -etanercept -indomethacin -medicines to increase blood counts like filgrastim, pegfilgrastim, sargramostim -medicines used as general anesthesia -metronidazole -natalizumab This list may not describe all possible interactions. Give your health care provider a list of all the medicines, herbs, non-prescription drugs, or dietary supplements you use. Also tell them if you smoke, drink alcohol, or use illegal drugs. Some items may interact with your medicine. What should I watch for while using this  medicine? Visit your doctor for checks on your progress. This drug may make you feel generally unwell. This is not uncommon, as chemotherapy can affect healthy cells as well as cancer cells. Report any side effects. Continue your course of treatment even though you feel ill unless your doctor tells you to stop. Drink water or other fluids as directed. Urinate often, even at night. In some cases, you may be given additional medicines to help with side effects. Follow all directions for their use. Call your doctor or health care professional for advice if you get a fever, chills or sore throat, or other symptoms of a cold or flu. Do not treat yourself. This drug decreases your body's ability to fight infections. Try to avoid being around people who are sick. This medicine may increase your risk to bruise or bleed. Call your doctor or health care professional if you notice any unusual bleeding. Be careful brushing and flossing your teeth or using a toothpick because you may get an infection or bleed more easily. If you have any dental work done, tell your dentist you are receiving this medicine. You may get drowsy or dizzy. Do not drive, use machinery, or do anything that needs mental alertness until you know how this medicine affects you. Do not become pregnant while taking this medicine or for 1 year after stopping it. Women should inform their doctor if they wish to become pregnant or think they might be pregnant. Men should not father a child while taking this medicine and for 4 months after stopping it. There  is a potential for serious side effects to an unborn child. Talk to your health care professional or pharmacist for more information. Do not breast-feed an infant while taking this medicine. This medicine may interfere with the ability to have a child. This medicine has caused ovarian failure in some women. This medicine has caused reduced sperm counts in some men. You should talk with your doctor or  health care professional if you are concerned about your fertility. If you are going to have surgery, tell your doctor or health care professional that you have taken this medicine. What side effects may I notice from receiving this medicine? Side effects that you should report to your doctor or health care professional as soon as possible: -allergic reactions like skin rash, itching or hives, swelling of the face, lips, or tongue -low blood counts - this medicine may decrease the number of white blood cells, red blood cells and platelets. You may be at increased risk for infections and bleeding. -signs of infection - fever or chills, cough, sore throat, pain or difficulty passing urine -signs of decreased platelets or bleeding - bruising, pinpoint red spots on the skin, black, tarry stools, blood in the urine -signs of decreased red blood cells - unusually weak or tired, fainting spells, lightheadedness -breathing problems -dark urine -dizziness -palpitations -swelling of the ankles, feet, hands -trouble passing urine or change in the amount of urine -weight gain -yellowing of the eyes or skin Side effects that usually do not require medical attention (report to your doctor or health care professional if they continue or are bothersome): -changes in nail or skin color -hair loss -missed menstrual periods -mouth sores -nausea, vomiting This list may not describe all possible side effects. Call your doctor for medical advice about side effects. You may report side effects to FDA at 1-800-FDA-1088. Where should I keep my medicine? This drug is given in a hospital or clinic and will not be stored at home. NOTE: This sheet is a summary. It may not cover all possible information. If you have questions about this medicine, talk to your doctor, pharmacist, or health care provider.    2016, Elsevier/Gold Standard. (2012-10-18 16:22:58)

## 2016-07-18 NOTE — Assessment & Plan Note (Signed)
Right lumpectomy 05/26/2016: IDC 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 Adjuvant chemotherapy with Taxotere and Cytoxan X 4 followed by 2. Adjuvant radiation therapy followed by 3. Adjuvant antiestrogen therapy ------------------------------------------------------------------------------------------------ Current treatment: Cycle 2 day 1 Taxotere and Cytoxan  Chemotoxicities: 1. Dehydration requiring IV fluids 2. Decreased oral intake 3. Loss of taste and appetite 4. Alopecia  5. Diarrhea: Improved with Imodium. 6. Low-grade fever: No evidence of infection. Reduced the dosage of cycle 2 of chemotherapy  Return to clinic in 3 weeks for cycle 3

## 2016-08-08 ENCOUNTER — Other Ambulatory Visit (HOSPITAL_BASED_OUTPATIENT_CLINIC_OR_DEPARTMENT_OTHER): Payer: PPO

## 2016-08-08 ENCOUNTER — Encounter: Payer: Self-pay | Admitting: Hematology and Oncology

## 2016-08-08 ENCOUNTER — Ambulatory Visit (HOSPITAL_BASED_OUTPATIENT_CLINIC_OR_DEPARTMENT_OTHER): Payer: PPO

## 2016-08-08 ENCOUNTER — Ambulatory Visit (HOSPITAL_BASED_OUTPATIENT_CLINIC_OR_DEPARTMENT_OTHER): Payer: PPO | Admitting: Hematology and Oncology

## 2016-08-08 DIAGNOSIS — C50411 Malignant neoplasm of upper-outer quadrant of right female breast: Secondary | ICD-10-CM

## 2016-08-08 DIAGNOSIS — E860 Dehydration: Secondary | ICD-10-CM | POA: Diagnosis not present

## 2016-08-08 DIAGNOSIS — E46 Unspecified protein-calorie malnutrition: Secondary | ICD-10-CM

## 2016-08-08 DIAGNOSIS — Z170 Estrogen receptor positive status [ER+]: Secondary | ICD-10-CM | POA: Diagnosis not present

## 2016-08-08 DIAGNOSIS — R197 Diarrhea, unspecified: Secondary | ICD-10-CM

## 2016-08-08 DIAGNOSIS — Z5111 Encounter for antineoplastic chemotherapy: Secondary | ICD-10-CM

## 2016-08-08 DIAGNOSIS — L658 Other specified nonscarring hair loss: Secondary | ICD-10-CM

## 2016-08-08 DIAGNOSIS — Z5189 Encounter for other specified aftercare: Secondary | ICD-10-CM | POA: Diagnosis not present

## 2016-08-08 LAB — COMPREHENSIVE METABOLIC PANEL
ALBUMIN: 3.5 g/dL (ref 3.5–5.0)
ALT: 20 U/L (ref 0–55)
AST: 16 U/L (ref 5–34)
Alkaline Phosphatase: 94 U/L (ref 40–150)
Anion Gap: 10 mEq/L (ref 3–11)
BUN: 14 mg/dL (ref 7.0–26.0)
CHLORIDE: 108 meq/L (ref 98–109)
CO2: 25 mEq/L (ref 22–29)
Calcium: 9.3 mg/dL (ref 8.4–10.4)
Creatinine: 0.7 mg/dL (ref 0.6–1.1)
EGFR: 86 mL/min/{1.73_m2} — ABNORMAL LOW (ref >90)
GLUCOSE: 140 mg/dL (ref 70–140)
POTASSIUM: 4.6 meq/L (ref 3.5–5.1)
SODIUM: 143 meq/L (ref 136–145)
Total Bilirubin: 0.99 mg/dL (ref 0.20–1.20)
Total Protein: 6.1 g/dL — ABNORMAL LOW (ref 6.4–8.3)

## 2016-08-08 LAB — CBC WITH DIFFERENTIAL/PLATELET
BASO%: 0 % (ref 0.0–2.0)
BASOS ABS: 0 10*3/uL (ref 0.0–0.1)
EOS%: 0 % (ref 0.0–7.0)
Eosinophils Absolute: 0 10*3/uL (ref 0.0–0.5)
HCT: 37 % (ref 34.8–46.6)
HEMOGLOBIN: 12.2 g/dL (ref 11.6–15.9)
LYMPH%: 5.5 % — ABNORMAL LOW (ref 14.0–49.7)
MCH: 30.5 pg (ref 25.1–34.0)
MCHC: 33 g/dL (ref 31.5–36.0)
MCV: 92.5 fL (ref 79.5–101.0)
MONO#: 0.2 10*3/uL (ref 0.1–0.9)
MONO%: 1.9 % (ref 0.0–14.0)
NEUT#: 7.7 10*3/uL — ABNORMAL HIGH (ref 1.5–6.5)
NEUT%: 92.6 % — ABNORMAL HIGH (ref 38.4–76.8)
Platelets: 226 10*3/uL (ref 145–400)
RBC: 4 10*6/uL (ref 3.70–5.45)
RDW: 14.7 % — AB (ref 11.2–14.5)
WBC: 8.4 10*3/uL (ref 3.9–10.3)
lymph#: 0.5 10*3/uL — ABNORMAL LOW (ref 0.9–3.3)

## 2016-08-08 MED ORDER — SODIUM CHLORIDE 0.9% FLUSH
10.0000 mL | INTRAVENOUS | Status: DC | PRN
  Administered 2016-08-08: 10 mL
  Filled 2016-08-08: qty 10

## 2016-08-08 MED ORDER — PEGFILGRASTIM 6 MG/0.6ML ~~LOC~~ PSKT
6.0000 mg | PREFILLED_SYRINGE | Freq: Once | SUBCUTANEOUS | Status: AC
  Administered 2016-08-08: 6 mg via SUBCUTANEOUS
  Filled 2016-08-08: qty 0.6

## 2016-08-08 MED ORDER — HEPARIN SOD (PORK) LOCK FLUSH 100 UNIT/ML IV SOLN
500.0000 [IU] | Freq: Once | INTRAVENOUS | Status: AC | PRN
  Administered 2016-08-08: 500 [IU]
  Filled 2016-08-08: qty 5

## 2016-08-08 MED ORDER — SODIUM CHLORIDE 0.9 % IV SOLN
65.0000 mg/m2 | Freq: Once | INTRAVENOUS | Status: AC
  Administered 2016-08-08: 130 mg via INTRAVENOUS
  Filled 2016-08-08: qty 13

## 2016-08-08 MED ORDER — SODIUM CHLORIDE 0.9 % IV SOLN
Freq: Once | INTRAVENOUS | Status: AC
  Administered 2016-08-08: 11:00:00 via INTRAVENOUS

## 2016-08-08 MED ORDER — PALONOSETRON HCL INJECTION 0.25 MG/5ML
0.2500 mg | Freq: Once | INTRAVENOUS | Status: AC
  Administered 2016-08-08: 0.25 mg via INTRAVENOUS

## 2016-08-08 MED ORDER — SODIUM CHLORIDE 0.9 % IV SOLN
10.0000 mg | Freq: Once | INTRAVENOUS | Status: AC
  Administered 2016-08-08: 10 mg via INTRAVENOUS
  Filled 2016-08-08: qty 1

## 2016-08-08 MED ORDER — SODIUM CHLORIDE 0.9 % IV SOLN
500.0000 mg/m2 | Freq: Once | INTRAVENOUS | Status: AC
  Administered 2016-08-08: 980 mg via INTRAVENOUS
  Filled 2016-08-08: qty 49

## 2016-08-08 MED ORDER — PALONOSETRON HCL INJECTION 0.25 MG/5ML
INTRAVENOUS | Status: AC
  Filled 2016-08-08: qty 5

## 2016-08-08 NOTE — Progress Notes (Signed)
Patient Care Team: Pcp Not In System as PCP - General  DIAGNOSIS: No matching staging information was found for the patient.  SUMMARY OF ONCOLOGIC HISTORY:   Malignant neoplasm of upper-outer quadrant of right female breast ([place])   04/05/2016 Mammogram    Asymmetry in the right breast lateral middle depth, 11 mm non-circumscribed mass ([facility] mammogram)      04/05/2016 Initial Diagnosis    Right breast biopsy: Invasive ductal carcinoma ER 90-100%, PR 20%, Ki-67 20%, HER-2 Neg, T1b N0 stage IA clinical stage      05/26/2016 Surgery    Right lumpectomy: IDC with DCIS 1.6 and goes, margins, 0/2 lymph nodes negative, ER 100%, PR 20%, Ki-67 15%, HER-2 negative ratio 1.0 T1 BN 0 stage IA pathologic stage, Oncotype DX 26, 17% ROR      06/27/2016 -  Chemotherapy    Adjuvant chemotherapy with Taxotere and Cytoxan 4       CHIEF COMPLIANT: Cycle 3 Taxotere and Cytoxan  INTERVAL HISTORY: Mercedes Nelson is a 65 year old with above-mentioned history of right breast cancer treated with lumpectomy and is currently on adjuvant chemotherapy. Today cycle 3 of Taxotere and Cytoxan. She had tolerated the cycle extremely well. She did not have diarrhea and nausea vomiting issues. The reduced dose of chemotherapy has been frequently improved her tolerance. Fatigue last about 7-10 days but it has not been stopping her. She's been able to function relatively normally. For the past 2 weeks her symptoms have improved.  REVIEW OF SYSTEMS:   Constitutional: Denies fevers, chills or abnormal weight loss, fatigue, alopecia Eyes: Denies blurriness of vision Ears, nose, mouth, throat, and face: Denies mucositis or sore throat Respiratory: Denies cough, dyspnea or wheezes Cardiovascular: Denies palpitation, chest discomfort Gastrointestinal:  Denies nausea, heartburn or change in bowel habits Skin: Denies abnormal skin rashes Lymphatics: Denies new lymphadenopathy or easy bruising Neurological:Denies numbness,  tingling or new weaknesses Behavioral/Psych: Mood is stable, no new changes  Extremities: No lower extremity edema Breast:  denies any pain or lumps or nodules in either breasts All other systems were reviewed with the patient and are negative.  I have reviewed the past medical history, past surgical history, social history and family history with the patient and they are unchanged from previous note.  ALLERGIES:  has No Known Allergies.  MEDICATIONS:  Current Outpatient Prescriptions  Medication Sig Dispense Refill  . dexamethasone (DECADRON) 4 MG tablet Take 1 tablet (4 mg total) by mouth daily. Start the day before Taxotere. Then again the day after chemo X 1 day 15 tablet 0  . HYDROcodone-acetaminophen (NORCO) 5-325 MG tablet Take 1-2 tablets by mouth every 6 (six) hours as needed. 30 tablet 0  . lidocaine-prilocaine (EMLA) cream Apply to affected area once 30 g 3  . Multiple Vitamin (MULTIVITAMIN) tablet Take 1 tablet by mouth daily.    . naproxen sodium (ANAPROX) 220 MG tablet Take 220 mg by mouth as needed (pain).     . ondansetron (ZOFRAN) 8 MG tablet Take 1 tablet (8 mg total) by mouth 2 (two) times daily as needed for refractory nausea / vomiting. Start on day 3 after chemo. 30 tablet 1  . prochlorperazine (COMPAZINE) 10 MG tablet Take 1 tablet (10 mg total) by mouth every 6 (six) hours as needed (Nausea or vomiting). 30 tablet 1  . simvastatin (ZOCOR) 40 MG tablet Take 20 mg by mouth daily.      No current facility-administered medications for this visit.     PHYSICAL  EXAMINATION: ECOG PERFORMANCE STATUS: 1 - Symptomatic but completely ambulatory  Vitals:   08/08/16 0842  BP: (!) 127/51  Pulse: 69  Resp: 18  Temp: 98.3 F (36.8 C)   Filed Weights   08/08/16 0842  Weight: 181 lb 1.6 oz (82.1 kg)    GENERAL:alert, no distress and comfortable SKIN: skin color, texture, turgor are normal, no rashes or significant lesions EYES: normal, Conjunctiva are pink and  non-injected, sclera clear OROPHARYNX:no exudate, no erythema and lips, buccal mucosa, and tongue normal  NECK: supple, thyroid normal size, non-tender, without nodularity LYMPH:  no palpable lymphadenopathy in the cervical, axillary or inguinal LUNGS: clear to auscultation and percussion with normal breathing effort HEART: regular rate & rhythm and no murmurs and no lower extremity edema ABDOMEN:abdomen soft, non-tender and normal bowel sounds MUSCULOSKELETAL:no cyanosis of digits and no clubbing  NEURO: alert & oriented x 3 with fluent speech, no focal motor/sensory deficits EXTREMITIES: No lower extremity edema  LABORATORY DATA:  I have reviewed the data as listed   Chemistry      Component Value Date/Time   NA 142 07/18/2016 0811   K 4.5 07/18/2016 0811   CL 106 06/23/2016 1530   CO2 26 07/18/2016 0811   BUN 18.8 07/18/2016 0811   CREATININE 0.7 07/18/2016 0811      Component Value Date/Time   CALCIUM 9.6 07/18/2016 0811   ALKPHOS 96 07/18/2016 0811   AST 16 07/18/2016 0811   ALT 23 07/18/2016 0811   BILITOT 0.89 07/18/2016 0811       Lab Results  Component Value Date   WBC 8.4 08/08/2016   HGB 12.2 08/08/2016   HCT 37.0 08/08/2016   MCV 92.5 08/08/2016   PLT 226 08/08/2016   NEUTROABS 7.7 (H) 08/08/2016     ASSESSMENT & PLAN:  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 Adjuvant chemotherapy with Taxotere and Cytoxan X 4 followed by 2. Adjuvant radiation therapy followed by 3. Adjuvant antiestrogen therapy ------------------------------------------------------------------------------------------------ Current treatment: Cycle 3 day 1 Taxotere and Cytoxan  Chemotoxicities: 1. Dehydration requiring  IV fluids 2. Decreased oral intake 3. Loss of taste and appetite 4. Alopecia  5. Diarrhea: Improved with Imodium. 6. Low-grade fever: No evidence of infection. Reduced the dosage of cycle 2 of chemotherapy  Return to clinic in 3 weeks for cycle 4   No orders of the defined types were placed in this encounter.  The patient has a good understanding of the overall plan. she agrees with it. she will call with any problems that may develop before the next visit here.   Rulon Eisenmenger, MD 08/08/16

## 2016-08-08 NOTE — Assessment & Plan Note (Signed)
Right lumpectomy 05/26/2016: IDC 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 Adjuvant chemotherapy with Taxotere and Cytoxan X 4 followed by 2. Adjuvant radiation therapy followed by 3. Adjuvant antiestrogen therapy ------------------------------------------------------------------------------------------------ Current treatment: Cycle 3 day 1 Taxotere and Cytoxan  Chemotoxicities: 1. Dehydration requiring IV fluids 2. Decreased oral intake 3. Loss of taste and appetite 4. Alopecia  5. Diarrhea: Improved with Imodium. 6. Low-grade fever: No evidence of infection. Reduced the dosage of cycle 2 of chemotherapy  Return to clinic in 3 weeks for cycle 4

## 2016-08-08 NOTE — Patient Instructions (Signed)
[facility] Discharge Instructions for Patients Receiving Chemotherapy  Today you received the following chemotherapy agents:  Taxotere, Cytoxan  To help prevent nausea and vomiting after your treatment, we encourage you to take your nausea medications as prescribed.   If you develop nausea and vomiting that is not controlled by your nausea medication, call the clinic.   BELOW ARE SYMPTOMS THAT SHOULD BE REPORTED IMMEDIATELY:  *FEVER GREATER THAN 100.5 F  *CHILLS WITH OR WITHOUT FEVER  NAUSEA AND VOMITING THAT IS NOT CONTROLLED WITH YOUR NAUSEA MEDICATION  *UNUSUAL SHORTNESS OF BREATH  *UNUSUAL BRUISING OR BLEEDING  TENDERNESS IN MOUTH AND THROAT WITH OR WITHOUT PRESENCE OF ULCERS  *URINARY PROBLEMS  *BOWEL PROBLEMS  UNUSUAL RASH Items with * indicate a potential emergency and should be followed up as soon as possible.  Feel free to call the clinic you have any questions or concerns. The clinic phone number is [phone].  Please show the [facility] at check-in to the Emergency Department and triage nurse.   Pegfilgrastim injection What is this medicine? PEGFILGRASTIM (PEG fil gra stim) is a long-acting granulocyte colony-stimulating factor that stimulates the growth of neutrophils, a type of white blood cell important in the body's fight against infection. It is used to reduce the incidence of fever and infection in patients with certain types of cancer who are receiving chemotherapy that affects the bone marrow, and to increase survival after being exposed to high doses of radiation. This medicine may be used for other purposes; ask your health care provider or pharmacist if you have questions. What should I tell my health care provider before I take this medicine? They need to know if you have any of these conditions: -kidney disease -latex allergy -ongoing radiation therapy -sickle cell disease -skin reactions to acrylic adhesives (On-Body  Injector only) -an unusual or allergic reaction to pegfilgrastim, filgrastim, other medicines, foods, dyes, or preservatives -pregnant or trying to get pregnant -breast-feeding How should I use this medicine? This medicine is for injection under the skin. If you get this medicine at home, you will be taught how to prepare and give the pre-filled syringe or how to use the On-body Injector. Refer to the patient Instructions for Use for detailed instructions. Use exactly as directed. Take your medicine at regular intervals. Do not take your medicine more often than directed. It is important that you put your used needles and syringes in a special sharps container. Do not put them in a trash can. If you do not have a sharps container, call your pharmacist or healthcare provider to get one. Talk to your pediatrician regarding the use of this medicine in children. While this drug may be prescribed for selected conditions, precautions do apply. Overdosage: If you think you have taken too much of this medicine contact a poison control center or emergency room at once. NOTE: This medicine is only for you. Do not share this medicine with others. What if I miss a dose? It is important not to miss your dose. Call your doctor or health care professional if you miss your dose. If you miss a dose due to an On-body Injector failure or leakage, a new dose should be administered as soon as possible using a single prefilled syringe for manual use. What may interact with this medicine? Interactions have not been studied. Give your health care provider a list of all the medicines, herbs, non-prescription drugs, or dietary supplements you use. Also tell them if you smoke,  drink alcohol, or use illegal drugs. Some items may interact with your medicine. This list may not describe all possible interactions. Give your health care provider a list of all the medicines, herbs, non-prescription drugs, or dietary supplements you  use. Also tell them if you smoke, drink alcohol, or use illegal drugs. Some items may interact with your medicine. What should I watch for while using this medicine? You may need blood work done while you are taking this medicine. If you are going to need a MRI, CT scan, or other procedure, tell your doctor that you are using this medicine (On-Body Injector only). What side effects may I notice from receiving this medicine? Side effects that you should report to your doctor or health care professional as soon as possible: -allergic reactions like skin rash, itching or hives, swelling of the face, lips, or tongue -dizziness -fever -pain, redness, or irritation at site where injected -pinpoint red spots on the skin -red or dark-brown urine -shortness of breath or breathing problems -stomach or side pain, or pain at the shoulder -swelling -tiredness -trouble passing urine or change in the amount of urine Side effects that usually do not require medical attention (report to your doctor or health care professional if they continue or are bothersome): -bone pain -muscle pain This list may not describe all possible side effects. Call your doctor for medical advice about side effects. You may report side effects to FDA at 1-800-FDA-1088. Where should I keep my medicine? Keep out of the reach of children. Store pre-filled syringes in a refrigerator between 2 and 8 degrees C (36 and 46 degrees F). Do not freeze. Keep in carton to protect from light. Throw away this medicine if it is left out of the refrigerator for more than 48 hours. Throw away any unused medicine after the expiration date. NOTE: This sheet is a summary. It may not cover all possible information. If you have questions about this medicine, talk to your doctor, pharmacist, or health care provider.    2016, Elsevier/Gold Standard. (2014-12-24 14:30:14)

## 2016-08-09 ENCOUNTER — Encounter: Payer: Self-pay | Admitting: Hematology and Oncology

## 2016-08-09 NOTE — Progress Notes (Signed)
See prev notes. I did fax [facility] approval letter for ondansetron/ will send to medical recrds also

## 2016-08-29 ENCOUNTER — Ambulatory Visit (HOSPITAL_BASED_OUTPATIENT_CLINIC_OR_DEPARTMENT_OTHER): Payer: PPO

## 2016-08-29 ENCOUNTER — Encounter: Payer: Self-pay | Admitting: *Deleted

## 2016-08-29 ENCOUNTER — Encounter: Payer: Self-pay | Admitting: Hematology and Oncology

## 2016-08-29 ENCOUNTER — Ambulatory Visit (HOSPITAL_BASED_OUTPATIENT_CLINIC_OR_DEPARTMENT_OTHER): Payer: PPO | Admitting: Hematology and Oncology

## 2016-08-29 ENCOUNTER — Other Ambulatory Visit (HOSPITAL_BASED_OUTPATIENT_CLINIC_OR_DEPARTMENT_OTHER): Payer: PPO

## 2016-08-29 DIAGNOSIS — C50411 Malignant neoplasm of upper-outer quadrant of right female breast: Secondary | ICD-10-CM

## 2016-08-29 DIAGNOSIS — Z5111 Encounter for antineoplastic chemotherapy: Secondary | ICD-10-CM | POA: Diagnosis not present

## 2016-08-29 DIAGNOSIS — Z5189 Encounter for other specified aftercare: Secondary | ICD-10-CM

## 2016-08-29 DIAGNOSIS — Z23 Encounter for immunization: Secondary | ICD-10-CM | POA: Diagnosis not present

## 2016-08-29 DIAGNOSIS — Z170 Estrogen receptor positive status [ER+]: Secondary | ICD-10-CM | POA: Diagnosis not present

## 2016-08-29 LAB — COMPREHENSIVE METABOLIC PANEL
ALT: 20 U/L (ref 0–55)
ANION GAP: 7 meq/L (ref 3–11)
AST: 14 U/L (ref 5–34)
Albumin: 3.6 g/dL (ref 3.5–5.0)
Alkaline Phosphatase: 91 U/L (ref 40–150)
BUN: 12.7 mg/dL (ref 7.0–26.0)
CHLORIDE: 108 meq/L (ref 98–109)
CO2: 25 meq/L (ref 22–29)
CREATININE: 0.7 mg/dL (ref 0.6–1.1)
Calcium: 9.4 mg/dL (ref 8.4–10.4)
Glucose: 87 mg/dl (ref 70–140)
POTASSIUM: 4 meq/L (ref 3.5–5.1)
Sodium: 141 mEq/L (ref 136–145)
Total Bilirubin: 0.97 mg/dL (ref 0.20–1.20)
Total Protein: 6.2 g/dL — ABNORMAL LOW (ref 6.4–8.3)

## 2016-08-29 LAB — CBC WITH DIFFERENTIAL/PLATELET
BASO%: 0.2 % (ref 0.0–2.0)
BASOS ABS: 0 10*3/uL (ref 0.0–0.1)
EOS ABS: 0 10*3/uL (ref 0.0–0.5)
EOS%: 0 % (ref 0.0–7.0)
HEMATOCRIT: 35.4 % (ref 34.8–46.6)
HGB: 11.6 g/dL (ref 11.6–15.9)
LYMPH#: 0.9 10*3/uL (ref 0.9–3.3)
LYMPH%: 7.5 % — AB (ref 14.0–49.7)
MCH: 30.3 pg (ref 25.1–34.0)
MCHC: 32.6 g/dL (ref 31.5–36.0)
MCV: 92.8 fL (ref 79.5–101.0)
MONO#: 0.8 10*3/uL (ref 0.1–0.9)
MONO%: 6.7 % (ref 0.0–14.0)
NEUT#: 10.2 10*3/uL — ABNORMAL HIGH (ref 1.5–6.5)
NEUT%: 85.6 % — AB (ref 38.4–76.8)
PLATELETS: 259 10*3/uL (ref 145–400)
RBC: 3.82 10*6/uL (ref 3.70–5.45)
RDW: 16.2 % — ABNORMAL HIGH (ref 11.2–14.5)
WBC: 11.9 10*3/uL — ABNORMAL HIGH (ref 3.9–10.3)

## 2016-08-29 MED ORDER — INFLUENZA VAC SPLIT QUAD 0.5 ML IM SUSY
0.5000 mL | PREFILLED_SYRINGE | Freq: Once | INTRAMUSCULAR | Status: AC
  Administered 2016-08-29: 0.5 mL via INTRAMUSCULAR
  Filled 2016-08-29: qty 0.5

## 2016-08-29 MED ORDER — PALONOSETRON HCL INJECTION 0.25 MG/5ML
INTRAVENOUS | Status: AC
  Filled 2016-08-29: qty 5

## 2016-08-29 MED ORDER — SODIUM CHLORIDE 0.9 % IV SOLN
10.0000 mg | Freq: Once | INTRAVENOUS | Status: AC
  Administered 2016-08-29: 10 mg via INTRAVENOUS
  Filled 2016-08-29: qty 1

## 2016-08-29 MED ORDER — PEGFILGRASTIM 6 MG/0.6ML ~~LOC~~ PSKT
6.0000 mg | PREFILLED_SYRINGE | Freq: Once | SUBCUTANEOUS | Status: AC
  Administered 2016-08-29: 6 mg via SUBCUTANEOUS
  Filled 2016-08-29: qty 0.6

## 2016-08-29 MED ORDER — SODIUM CHLORIDE 0.9 % IV SOLN
500.0000 mg/m2 | Freq: Once | INTRAVENOUS | Status: AC
  Administered 2016-08-29: 980 mg via INTRAVENOUS
  Filled 2016-08-29: qty 49

## 2016-08-29 MED ORDER — SODIUM CHLORIDE 0.9% FLUSH
10.0000 mL | INTRAVENOUS | Status: DC | PRN
  Administered 2016-08-29: 10 mL
  Filled 2016-08-29: qty 10

## 2016-08-29 MED ORDER — SODIUM CHLORIDE 0.9 % IV SOLN
65.0000 mg/m2 | Freq: Once | INTRAVENOUS | Status: AC
  Administered 2016-08-29: 130 mg via INTRAVENOUS
  Filled 2016-08-29: qty 13

## 2016-08-29 MED ORDER — HEPARIN SOD (PORK) LOCK FLUSH 100 UNIT/ML IV SOLN
500.0000 [IU] | Freq: Once | INTRAVENOUS | Status: AC | PRN
  Administered 2016-08-29: 500 [IU]
  Filled 2016-08-29: qty 5

## 2016-08-29 MED ORDER — SODIUM CHLORIDE 0.9 % IV SOLN
Freq: Once | INTRAVENOUS | Status: AC
  Administered 2016-08-29: 09:00:00 via INTRAVENOUS

## 2016-08-29 MED ORDER — PALONOSETRON HCL INJECTION 0.25 MG/5ML
0.2500 mg | Freq: Once | INTRAVENOUS | Status: AC
  Administered 2016-08-29: 0.25 mg via INTRAVENOUS

## 2016-08-29 NOTE — Assessment & Plan Note (Signed)
Right lumpectomy 05/26/2016: IDC 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 Adjuvant chemotherapy with Taxotere and Cytoxan X 33fllowed by 2. Adjuvant radiation therapy followed by 3. Adjuvant antiestrogen therapy ------------------------------------------------------------------------------------------------ Current treatment: Cycle 4day 1Taxotere and Cytoxan  Chemotoxicities: 1. Dehydration requiring IV fluids 2. Decreased oral intake 3. Loss of taste and appetite 4. Alopecia  5. Diarrhea: Improved withImodium. 6. Low-grade fever: No evidence of infection. Reducedthe dosage of cycle 2 of chemotherapy  Port can be removed Radiation oncology consultation  Return to clinic in 3 months to start antiestrogen therapy.

## 2016-08-29 NOTE — Patient Instructions (Signed)
[facility] Discharge Instructions for Patients Receiving Chemotherapy  Today you received the following chemotherapy agents:  Taxotere, Cytoxan  To help prevent nausea and vomiting after your treatment, we encourage you to take your nausea medication as prescribed.   If you develop nausea and vomiting that is not controlled by your nausea medication, call the clinic.   BELOW ARE SYMPTOMS THAT SHOULD BE REPORTED IMMEDIATELY:  *FEVER GREATER THAN 100.5 F  *CHILLS WITH OR WITHOUT FEVER  NAUSEA AND VOMITING THAT IS NOT CONTROLLED WITH YOUR NAUSEA MEDICATION  *UNUSUAL SHORTNESS OF BREATH  *UNUSUAL BRUISING OR BLEEDING  TENDERNESS IN MOUTH AND THROAT WITH OR WITHOUT PRESENCE OF ULCERS  *URINARY PROBLEMS  *BOWEL PROBLEMS  UNUSUAL RASH Items with * indicate a potential emergency and should be followed up as soon as possible.  Feel free to call the clinic you have any questions or concerns. The clinic phone number is [phone].  Please show the CHEMO ALERT CARD at check-in to the Emergency Department and triage nurse.   

## 2016-08-29 NOTE — Progress Notes (Signed)
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[place])   04/05/2016 Mammogram    Asymmetry in the right breast lateral middle depth, 11 mm non-circumscribed mass ([facility] mammogram)      04/05/2016 Initial Diagnosis    Right breast biopsy: Invasive ductal carcinoma ER 90-100%, PR 20%, Ki-67 20%, HER-2 Neg, T1b N0 stage IA clinical stage      05/26/2016 Surgery    Right lumpectomy: IDC with DCIS 1.6 and goes, margins, 0/2 lymph nodes negative, ER 100%, PR 20%, Ki-67 15%, HER-2 negative ratio 1.0 T1 BN 0 stage IA pathologic stage, Oncotype DX 26, 17% ROR      06/27/2016 - 08/29/2016 Chemotherapy    Adjuvant chemotherapy with Taxotere and Cytoxan 4       CHIEF COMPLIANT: Cycle 4 Taxotere and Cytoxan  INTERVAL HISTORY: Mercedes Nelson is a 65 year old with above-mentioned history of right breast cancer treated with lumpectomy and today's the last cycle of adjuvant chemotherapy with Taxotere and Cytoxan. She done much better with the last cycle. She did not have any fevers or chills. She had bone pain for a couple of days as well as nausea for a couple of days. Nausea improved with antiemetics. She's been eating fairly well with eating more protein. Energy levels have been relatively stable. Her taste does come back in the third week of treatment.  REVIEW OF SYSTEMS:   Constitutional: Denies fevers, chills or abnormal weight loss Eyes: Denies blurriness of vision Ears, nose, mouth, throat, and face: Denies mucositis or sore throat Respiratory: Denies cough, dyspnea or wheezes Cardiovascular: Denies palpitation, chest discomfort Gastrointestinal:  Denies nausea, heartburn or change in bowel habits Skin: Denies abnormal skin rashes Lymphatics: Denies new lymphadenopathy or easy bruising Neurological:Denies numbness, tingling or new weaknesses Behavioral/Psych: Mood is stable, no new  changes  Extremities: No lower extremity edema All other systems were reviewed with the patient and are negative.  I have reviewed the past medical history, past surgical history, social history and family history with the patient and they are unchanged from previous note.  ALLERGIES:  has No Known Allergies.  MEDICATIONS:  Current Outpatient Prescriptions  Medication Sig Dispense Refill  . dexamethasone (DECADRON) 4 MG tablet Take 1 tablet (4 mg total) by mouth daily. Start the day before Taxotere. Then again the day after chemo X 1 day 15 tablet 0  . HYDROcodone-acetaminophen (NORCO) 5-325 MG tablet Take 1-2 tablets by mouth every 6 (six) hours as needed. 30 tablet 0  . lidocaine-prilocaine (EMLA) cream Apply to affected area once 30 g 3  . Multiple Vitamin (MULTIVITAMIN) tablet Take 1 tablet by mouth daily.    . naproxen sodium (ANAPROX) 220 MG tablet Take 220 mg by mouth as needed (pain).     . ondansetron (ZOFRAN) 8 MG tablet Take 1 tablet (8 mg total) by mouth 2 (two) times daily as needed for refractory nausea / vomiting. Start on day 3 after chemo. 30 tablet 1  . prochlorperazine (COMPAZINE) 10 MG tablet Take 1 tablet (10 mg total) by mouth every 6 (six) hours as needed (Nausea or vomiting). 30 tablet 1  . simvastatin (ZOCOR) 40 MG tablet Take 20 mg by mouth daily.      No current facility-administered medications for this visit.     PHYSICAL EXAMINATION: ECOG PERFORMANCE STATUS: 1 - Symptomatic but completely ambulatory  Vitals:   08/29/16 6767  BP: (!) 139/56  Pulse: 65  Resp: 18  Temp: 98.3 F (36.8 C)   Filed Weights   08/29/16 0842  Weight: 184 lb 8 oz (83.7 kg)    GENERAL:alert, no distress and comfortable SKIN: skin color, texture, turgor are normal, no rashes or significant lesions EYES: normal, Conjunctiva are pink and non-injected, sclera clear OROPHARYNX:no exudate, no erythema and lips, buccal mucosa, and tongue normal  NECK: supple, thyroid normal size,  non-tender, without nodularity LYMPH:  no palpable lymphadenopathy in the cervical, axillary or inguinal LUNGS: clear to auscultation and percussion with normal breathing effort HEART: regular rate & rhythm and no murmurs and no lower extremity edema ABDOMEN:abdomen soft, non-tender and normal bowel sounds MUSCULOSKELETAL:no cyanosis of digits and no clubbing  NEURO: alert & oriented x 3 with fluent speech, no focal motor/sensory deficits EXTREMITIES: No lower extremity edema   LABORATORY DATA:  I have reviewed the data as listed   Chemistry      Component Value Date/Time   NA 143 08/08/2016 0828   K 4.6 08/08/2016 0828   CL 106 06/23/2016 1530   CO2 25 08/08/2016 0828   BUN 14.0 08/08/2016 0828   CREATININE 0.7 08/08/2016 0828      Component Value Date/Time   CALCIUM 9.3 08/08/2016 0828   ALKPHOS 94 08/08/2016 0828   AST 16 08/08/2016 0828   ALT 20 08/08/2016 0828   BILITOT 0.99 08/08/2016 0828       Lab Results  Component Value Date   WBC 11.9 (H) 08/29/2016   HGB 11.6 08/29/2016   HCT 35.4 08/29/2016   MCV 92.8 08/29/2016   PLT 259 08/29/2016   NEUTROABS 10.2 (H) 08/29/2016     ASSESSMENT & PLAN:  Malignant neoplasm of upper-outer quadrant of right female breast ([place]) Right lumpectomy 05/26/2016: IDC 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 Adjuvant chemotherapy with Taxotere and Cytoxan X 58fllowed by 2. Adjuvant radiation therapy followed by 3. Adjuvant antiestrogen therapy ------------------------------------------------------------------------------------------------ Current treatment: Cycle 4day 1Taxotere and Cytoxan  Chemotoxicities: 1. Dehydration requiring IV fluids 2. Decreased oral intake 3. Loss of taste and appetite 4. Alopecia  5. Diarrhea: Improved withImodium. 6. Low-grade  fever: No evidence of infection. Reducedthe dosage of cycle 2 of chemotherapy  Port can be removed Radiation oncology consultation  Return to clinic in 3 months to start antiestrogen therapy.   No orders of the defined types were placed in this encounter.  The patient has a good understanding of the overall plan. she agrees with it. she will call with any problems that may develop before the next visit here.   GRulon Eisenmenger MD 08/29/16

## 2016-09-13 DIAGNOSIS — Z452 Encounter for adjustment and management of vascular access device: Secondary | ICD-10-CM | POA: Diagnosis not present

## 2016-09-13 DIAGNOSIS — Z853 Personal history of malignant neoplasm of breast: Secondary | ICD-10-CM | POA: Diagnosis not present

## 2016-09-13 DIAGNOSIS — C50119 Malignant neoplasm of central portion of unspecified female breast: Secondary | ICD-10-CM | POA: Diagnosis not present

## 2016-09-13 NOTE — Progress Notes (Addendum)
Follow Up New Consult Right breast Upper Outer Quadrant  Seen in 05/16/16   Diagnosis 05/26/2016: 1. Breast, lumpectomy, Right - INVASIVE AND IN SITU DUCTAL CARCINOMA, 1.6 CM.- MARGINS NOT INVOLVED.- BIOPSY SITE REACTION. 2. Lymph node, sentinel, biopsy, Right node #1 - ONE BENIGN LYMPH NODE (0/1). 3. Lymph node, biopsy, Nonsentinel - ONE BENIGN LYMPH NODE (0/1).  Dr. Lindi Adie MD  Note 9/12/217: Adjuvant chemotherapy with Taxotere and Cytoxan x4 ,completed, port removed 09/13/16; Follow up 11/28/16 to start antiestrogen therapy  Tenderness where power port removed left subclavian,  No Pacemaker,  BP 116/62 (BP Location: Left Arm, Patient Position: Sitting, Cuff Size: Normal)   Pulse 65   Temp 97.7 F (36.5 C) (Oral)   Resp 16   Ht 5\' 7"  (1.702 m)   Wt 187 lb 4.8 oz (85 kg)   LMP  (LMP Unknown)   BMI 29.34 kg/m   Wt Readings from Last 3 Encounters:  09/18/16 187 lb 4.8 oz (85 kg)  08/29/16 184 lb 8 oz (83.7 kg)  08/08/16 181 lb 1.6 oz (82.1 kg)

## 2016-09-18 ENCOUNTER — Encounter: Payer: Self-pay | Admitting: Radiation Oncology

## 2016-09-18 ENCOUNTER — Ambulatory Visit
Admission: RE | Admit: 2016-09-18 | Discharge: 2016-09-18 | Disposition: A | Discharge status: Home / Self Care | Payer: PPO | Source: Ambulatory Visit | Attending: Radiation Oncology | Admitting: Radiation Oncology

## 2016-09-18 DIAGNOSIS — Z9221 Personal history of antineoplastic chemotherapy: Secondary | ICD-10-CM | POA: Diagnosis not present

## 2016-09-18 DIAGNOSIS — C50911 Malignant neoplasm of unspecified site of right female breast: Secondary | ICD-10-CM | POA: Insufficient documentation

## 2016-09-18 DIAGNOSIS — E079 Disorder of thyroid, unspecified: Secondary | ICD-10-CM | POA: Diagnosis not present

## 2016-09-18 DIAGNOSIS — C50411 Malignant neoplasm of upper-outer quadrant of right female breast: Secondary | ICD-10-CM

## 2016-09-18 DIAGNOSIS — Z87891 Personal history of nicotine dependence: Secondary | ICD-10-CM | POA: Insufficient documentation

## 2016-09-18 DIAGNOSIS — Z9889 Other specified postprocedural states: Secondary | ICD-10-CM | POA: Diagnosis not present

## 2016-09-18 DIAGNOSIS — Z170 Estrogen receptor positive status [ER+]: Secondary | ICD-10-CM | POA: Insufficient documentation

## 2016-09-18 MED ORDER — HEPARIN SOD (PORK) LOCK FLUSH 100 UNIT/ML IV SOLN: 500.0000 [IU] | Freq: Once | INTRAVENOUS | Status: DC

## 2016-09-18 MED ORDER — SODIUM CHLORIDE 0.9% FLUSH: 10.0000 mL | Freq: Once | INTRAVENOUS | Status: DC

## 2016-09-18 NOTE — Progress Notes (Signed)
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 The patient consulted with me on 05/16/16. She was felt to be a good candidate for breast conservation treatment. She has completed surgery consisting of a right lumpectomy and right axillary sentinel lymph node biopsy on 05/26/16. Final pathology revealed grade 2 invasive and in situ ductal carcinoma with the tumor spanning 1.6 cm and clear margins. A biopsied right sentinel node was negative and a biopsied nonsentitnel node was negative. After surgery, the patient had a seroma drained.  Oncotype DX score was 26, or a 17% risk of recurrence. The patient has completed chemotherapy with 4 cycles of Taxotere and Cytoxan (06/27/16 - 08/29/16). The patient had her port removed on 09/13/16. She is appropriate to proceed with adjuvant radiation treatment at this time.  On review of systems: The patient reports tenderness where her port was removed (left subclavian).  ALLERGIES:  has No Known Allergies.  Meds: Current Outpatient Prescriptions  Medication Sig Dispense Refill  . Multiple Vitamin (MULTIVITAMIN) tablet Take 1 tablet by mouth daily.    . simvastatin (ZOCOR) 40 MG tablet Take 20 mg by mouth daily.     Marland Kitchen HYDROcodone-acetaminophen (NORCO) 5-325 MG tablet Take 1-2 tablets by mouth every 6 (six) hours as needed. (Patient not taking: Reported on 09/18/2016) 30 tablet 0  . lidocaine-prilocaine (EMLA) cream Apply to affected area once (Patient not taking: Reported on 09/18/2016) 30 g 3  . naproxen sodium (ANAPROX) 220 MG tablet Take 220 mg by mouth as needed (pain).     . ondansetron (ZOFRAN) 8 MG tablet  Take 1 tablet (8 mg total) by mouth 2 (two) times daily as needed for refractory nausea / vomiting. Start on day 3 after chemo. (Patient not taking: Reported on 09/18/2016) 30 tablet 1  . prochlorperazine (COMPAZINE) 10 MG tablet Take 1 tablet (10 mg total) by mouth every 6 (six) hours as needed (Nausea or vomiting). (Patient not taking: Reported on 09/18/2016) 30 tablet 1   No current facility-administered medications for this encounter.     Physical Findings: The patient is in no acute distress. Patient is alert and oriented.  height is _0  (1.702 m) and weight is 187 lb 4.8 oz (85 kg). Her oral temperature is 97.7 F (36.5 C). Her blood pressure is 116/62 and her pulse is 65. Her respiration is 16. .   The patient is s/p right lumpectomy. Well healed lumpectomy incision in the UOQ of the right breast. Well healed right axillary incision.  Lab Findings: Lab Results  Component Value Date   WBC 11.9 (H) 08/29/2016   HGB 11.6 08/29/2016   HCT 35.4 08/29/2016   MCV 92.8 08/29/2016   PLT 259 08/29/2016     Radiographic Findings: No results found.  Impression:    The patient is status post lumpectomy and chemotherapy as part of her breast conservation treatment strategy. The patient is appropriate to proceed with adjuvant radiation treatment at this time.  I discussed with the patient the role of radiation treatment in this setting. We discussed the expected benefit in terms of local/regional control area and we also discussed the possible side effects  and risks of treatment. All of her questions were answered.  We also discussed the logistics of treatment. The patient wishes to proceed with CT simulation.  Plan: The patient signed a consent form and this was placed in her medical chart. The patient will be scheduled for a simulation in the near future such that we can begin treatment planning. I anticipate treating the patient with a 4 week course of radiation treatment. This will  correspond to whole breast radiation treatment to the right breast using tangent fields.   Jodelle Gross, M.D., Ph.D.  This document serves as a record of services personally performed by Kyung Rudd, MD. It was created on his behalf by Darcus Austin, a trained medical scribe. The creation of this record is based on the scribe's personal observations and the provider's statements to them. This document has been checked and approved by the attending provider.

## 2016-09-18 NOTE — Addendum Note (Signed)
Encounter addended by: Doreen Beam, RN on: 09/18/2016  8:59 AM<BR>    Actions taken: Order Entry activity accessed, Diagnosis association updated

## 2016-09-18 NOTE — Progress Notes (Signed)
Please see the Nurse Progress Note in the MD Initial Consult Encounter for this patient. 

## 2016-09-27 ENCOUNTER — Ambulatory Visit
Admission: RE | Admit: 2016-09-27 | Discharge: 2016-09-27 | Disposition: A | Discharge status: Home / Self Care | Payer: PPO | Source: Ambulatory Visit | Attending: Radiation Oncology | Admitting: Radiation Oncology

## 2016-09-27 DIAGNOSIS — Z170 Estrogen receptor positive status [ER+]: Principal | ICD-10-CM

## 2016-09-27 DIAGNOSIS — C50411 Malignant neoplasm of upper-outer quadrant of right female breast: Secondary | ICD-10-CM | POA: Diagnosis not present

## 2016-09-27 DIAGNOSIS — C50911 Malignant neoplasm of unspecified site of right female breast: Secondary | ICD-10-CM | POA: Diagnosis not present

## 2016-09-27 NOTE — Progress Notes (Signed)
  Radiation Oncology         [phone]) [phone] ________________________________  Name: BRYNDA DEWATER MRN: [id number]  Date: 09/27/2016  DOB: March 03, 1951  Optical Surface Tracking Plan:  Since intensity modulated radiotherapy (IMRT) and 3D conformal radiation treatment methods are predicated on accurate and precise positioning for treatment, intrafraction motion monitoring is medically necessary to ensure accurate and safe treatment delivery.  The ability to quantify intrafraction motion without excessive ionizing radiation dose can only be performed with optical surface tracking. Accordingly, surface imaging offers the opportunity to obtain 3D measurements of patient position throughout IMRT and 3D treatments without excessive radiation exposure.  I am ordering optical surface tracking for this patient's upcoming course of radiotherapy. ________________________________  Kyung Rudd, MD 09/27/2016 6:01 PM    Reference:   Ursula Alert, J, et al. Surface imaging-based analysis of intrafraction motion for breast radiotherapy patients.Journal of [place], n. 6, nov. 2014. ISSN [id number].   Available at: <http://www.jacmp.org/index.php/jacmp/article/view/4957>.

## 2016-09-27 NOTE — Progress Notes (Signed)
  Radiation Oncology         [phone]) [phone] ________________________________  Name: Mercedes Nelson MRN: [id number]  Date: 09/27/2016  DOB: 03-10-1951   DIAGNOSIS:     ICD-9-CM ICD-10-CM   1. Malignant neoplasm of upper-outer quadrant of right breast in female, estrogen receptor positive ([facility]) 174.4 C50.411    V86.0 Z17.0     SIMULATION AND TREATMENT PLANNING NOTE  The patient presented for simulation prior to beginning her course of radiation treatment for her diagnosis of right-sided breast cancer. The patient was placed in a supine position on a breast board. A customized vac-lock bag was constructed and this complex treatment device will be used on a daily basis during her treatment. In this fashion, a CT scan was obtained through the chest area and an isocenter was placed near the chest wall within the breast.  The patient will be planned to receive a course of radiation initially to a dose of 42.5 Gy. This will consist of a whole breast radiotherapy technique. To accomplish this, 2 customized blocks have been designed which will correspond to medial and lateral whole breast tangent fields. This treatment will be accomplished at 2.5 Gy per fraction. A forward planning technique will also be evaluated to determine if this approach improves the plan. It is anticipated that the patient will then receive a 7.5 Gy boost to the seroma cavity which has been contoured. This will be accomplished at 2.5 Gy per fraction.   This initial treatment will consist of a 3-D conformal technique. The seroma has been contoured as the primary target structure. Additionally, dose volume histograms of both this target as well as the lungs and heart will also be evaluated. Such an approach is necessary to ensure that the target area is adequately covered while the nearby critical  normal structures are adequately spared.  Plan:  The final anticipated total dose therefore will correspond to 50  Gy.    _______________________________   Jodelle Gross, MD, PhD

## 2016-09-28 DIAGNOSIS — C50911 Malignant neoplasm of unspecified site of right female breast: Secondary | ICD-10-CM | POA: Diagnosis not present

## 2016-09-28 DIAGNOSIS — E785 Hyperlipidemia, unspecified: Secondary | ICD-10-CM | POA: Diagnosis not present

## 2016-10-02 DIAGNOSIS — C50911 Malignant neoplasm of unspecified site of right female breast: Secondary | ICD-10-CM | POA: Diagnosis not present

## 2016-10-02 DIAGNOSIS — C50411 Malignant neoplasm of upper-outer quadrant of right female breast: Secondary | ICD-10-CM | POA: Diagnosis not present

## 2016-10-04 ENCOUNTER — Ambulatory Visit
Admission: RE | Admit: 2016-10-04 | Discharge: 2016-10-04 | Disposition: A | Discharge status: Home / Self Care | Payer: PPO | Source: Ambulatory Visit | Attending: Radiation Oncology | Admitting: Radiation Oncology

## 2016-10-04 DIAGNOSIS — C50411 Malignant neoplasm of upper-outer quadrant of right female breast: Secondary | ICD-10-CM | POA: Diagnosis not present

## 2016-10-04 DIAGNOSIS — C50911 Malignant neoplasm of unspecified site of right female breast: Secondary | ICD-10-CM | POA: Diagnosis not present

## 2016-10-05 ENCOUNTER — Ambulatory Visit
Admission: RE | Admit: 2016-10-05 | Discharge: 2016-10-05 | Disposition: A | Discharge status: Home / Self Care | Payer: PPO | Source: Ambulatory Visit | Attending: Radiation Oncology | Admitting: Radiation Oncology

## 2016-10-05 DIAGNOSIS — C50411 Malignant neoplasm of upper-outer quadrant of right female breast: Secondary | ICD-10-CM

## 2016-10-05 DIAGNOSIS — C50911 Malignant neoplasm of unspecified site of right female breast: Secondary | ICD-10-CM | POA: Diagnosis not present

## 2016-10-05 DIAGNOSIS — Z170 Estrogen receptor positive status [ER+]: Principal | ICD-10-CM

## 2016-10-05 MED ORDER — ALRA NON-METALLIC DEODORANT (RAD-ONC)
1.0000 "application " | Freq: Once | TOPICAL | Status: AC
  Administered 2016-10-05: 1 via TOPICAL

## 2016-10-05 MED ORDER — RADIAPLEXRX EX GEL
Freq: Once | CUTANEOUS | Status: AC
  Administered 2016-10-05: 17:00:00 via TOPICAL

## 2016-10-05 NOTE — Progress Notes (Signed)
pateint education , radiation therapy and you book, my business card, alra deodorant and radiaplex gel given, discussed ways to manage side effects ,skin irritation, tendersness/swelling of breast, fatiue, use of electric razor for axilla, no underwire bras,, increase protein in your diet, drink plenty fluids, stay hydrated, sees staff RN and MD weekly and prn, teach back given 5:24 PM

## 2016-10-06 ENCOUNTER — Encounter: Payer: Self-pay | Admitting: Radiation Oncology

## 2016-10-06 ENCOUNTER — Ambulatory Visit
Admission: RE | Admit: 2016-10-06 | Discharge: 2016-10-06 | Disposition: A | Discharge status: Home / Self Care | Payer: PPO | Source: Ambulatory Visit | Attending: Radiation Oncology | Admitting: Radiation Oncology

## 2016-10-06 DIAGNOSIS — C50911 Malignant neoplasm of unspecified site of right female breast: Secondary | ICD-10-CM | POA: Diagnosis not present

## 2016-10-06 DIAGNOSIS — C50411 Malignant neoplasm of upper-outer quadrant of right female breast: Secondary | ICD-10-CM | POA: Diagnosis not present

## 2016-10-09 ENCOUNTER — Ambulatory Visit
Admission: RE | Admit: 2016-10-09 | Discharge: 2016-10-09 | Disposition: A | Discharge status: Home / Self Care | Payer: PPO | Source: Ambulatory Visit | Attending: Radiation Oncology | Admitting: Radiation Oncology

## 2016-10-09 DIAGNOSIS — C50911 Malignant neoplasm of unspecified site of right female breast: Secondary | ICD-10-CM | POA: Diagnosis not present

## 2016-10-09 DIAGNOSIS — C50411 Malignant neoplasm of upper-outer quadrant of right female breast: Secondary | ICD-10-CM | POA: Diagnosis not present

## 2016-10-10 ENCOUNTER — Ambulatory Visit
Admission: RE | Admit: 2016-10-10 | Discharge: 2016-10-10 | Disposition: A | Discharge status: Home / Self Care | Payer: PPO | Source: Ambulatory Visit | Attending: Radiation Oncology | Admitting: Radiation Oncology

## 2016-10-10 DIAGNOSIS — C50911 Malignant neoplasm of unspecified site of right female breast: Secondary | ICD-10-CM | POA: Diagnosis not present

## 2016-10-10 DIAGNOSIS — C50411 Malignant neoplasm of upper-outer quadrant of right female breast: Secondary | ICD-10-CM | POA: Diagnosis not present

## 2016-10-11 ENCOUNTER — Ambulatory Visit: Payer: PPO

## 2016-10-11 ENCOUNTER — Ambulatory Visit
Admission: RE | Admit: 2016-10-11 | Discharge: 2016-10-11 | Disposition: A | Discharge status: Home / Self Care | Payer: PPO | Source: Ambulatory Visit | Attending: Radiation Oncology | Admitting: Radiation Oncology

## 2016-10-12 ENCOUNTER — Ambulatory Visit
Admission: RE | Admit: 2016-10-12 | Discharge: 2016-10-12 | Disposition: A | Discharge status: Home / Self Care | Payer: PPO | Source: Ambulatory Visit | Attending: Radiation Oncology | Admitting: Radiation Oncology

## 2016-10-12 ENCOUNTER — Encounter: Payer: Self-pay | Admitting: Radiation Oncology

## 2016-10-12 VITALS — BP 126/56 | HR 56 | Temp 98.5°F | Resp 20 | Wt 179.2 lb

## 2016-10-12 DIAGNOSIS — Z170 Estrogen receptor positive status [ER+]: Principal | ICD-10-CM

## 2016-10-12 DIAGNOSIS — C50411 Malignant neoplasm of upper-outer quadrant of right female breast: Secondary | ICD-10-CM

## 2016-10-12 DIAGNOSIS — C50911 Malignant neoplasm of unspecified site of right female breast: Secondary | ICD-10-CM | POA: Diagnosis not present

## 2016-10-12 NOTE — Progress Notes (Signed)
Weekly rad txs 5/20 right breast completed, mild erythma, using radaiplex  Bid, no c/o pain , tightness in  Right arm when stretched out, appetite good, energy is good 1:10 PM BP (!) 126/56 (BP Location: Left Arm, Patient Position: Sitting, Cuff Size: Normal)   Pulse (!) 56   Temp 98.5 F (36.9 C) (Oral)   Resp 20   Wt 179 lb 3.2 oz (81.3 kg)   LMP  (LMP Unknown)   BMI 28.07 kg/m   Wt Readings from Last 3 Encounters:  10/12/16 179 lb 3.2 oz (81.3 kg)  09/18/16 187 lb 4.8 oz (85 kg)  08/29/16 184 lb 8 oz (83.7 kg)

## 2016-10-12 NOTE — Progress Notes (Signed)
  Radiation Oncology         [phone]   Name: Mercedes Nelson MRN: [id number]   Date: 10/12/2016  DOB: 03-27-1951   Weekly Radiation Therapy Management    ICD-9-CM ICD-10-CM   1. Malignant neoplasm of upper-outer quadrant of right breast in female, estrogen receptor positive (HCC) 174.4 C50.411    V86.0 Z17.0     Current Dose: 12.5 Gy  Planned Dose:  50 Gy  Narrative The patient presents for routine under treatment assessment.  Weekly rad txs 5/20 right breast completed. Nursing noted mild erythema. Patient reports using radiaplex bid. Patient has no complaints of pain, but reports tightness in her right arm when stretched out. The tightness began several weeks ago and went away, but has reoccured in the last few days. Patient reports a good appetite, and good energy levels.   The patient is without complaint. Set-up films were reviewed. The chart was checked.  Physical Findings  weight is 179 lb 3.2 oz (81.3 kg). Her oral temperature is 98.5 F (36.9 C). Her blood pressure is 126/56 (abnormal) and her pulse is 56 (abnormal). Her respiration is 20. . Weight essentially stable.  No significant changes.   Impression The patient is tolerating radiation.  Plan Continue treatment as planned. We discussed the possibility of physical therapy for the sensation of tightness in her arm, but the patient would like to wait for the time being.         Sheral Apley Tammi Klippel, M.D.  This document serves as a record of services personally performed by Tyler Pita, MD and Shona Simpson, PA. It was created on his behalf by Maryla Morrow, a trained medical scribe. The creation of this record is based on the scribe's personal observations and the provider's statements to them. This document has been checked and approved by the attending provider.

## 2016-10-13 ENCOUNTER — Ambulatory Visit
Admission: RE | Admit: 2016-10-13 | Discharge: 2016-10-13 | Disposition: A | Discharge status: Home / Self Care | Payer: PPO | Source: Ambulatory Visit | Attending: Radiation Oncology | Admitting: Radiation Oncology

## 2016-10-13 DIAGNOSIS — Z170 Estrogen receptor positive status [ER+]: Principal | ICD-10-CM

## 2016-10-13 DIAGNOSIS — C50411 Malignant neoplasm of upper-outer quadrant of right female breast: Secondary | ICD-10-CM | POA: Diagnosis not present

## 2016-10-13 DIAGNOSIS — C50911 Malignant neoplasm of unspecified site of right female breast: Secondary | ICD-10-CM | POA: Diagnosis not present

## 2016-10-13 NOTE — Progress Notes (Signed)
   Department of Radiation Oncology  Phone:  [phone] Fax:        [phone]  Weekly Treatment Note    Name: Mercedes Nelson Date: 10/13/2016 MRN: [id number] DOB: 11/14/1951   Diagnosis:     ICD-9-CM ICD-10-CM   1. Malignant neoplasm of upper-outer quadrant of right breast in female, estrogen receptor positive ([place]) 174.4 C50.411    V86.0 Z17.0      Current dose: 15 Gy  Current fraction:6   MEDICATIONS: Current Outpatient Prescriptions  Medication Sig Dispense Refill  . hyaluronate sodium (RADIAPLEXRX) GEL Apply 1 application topically 2 (two) times daily.    . Multiple Vitamin (MULTIVITAMIN) tablet Take 1 tablet by mouth daily.    . naproxen sodium (ANAPROX) 220 MG tablet Take 220 mg by mouth as needed (pain).     . non-metallic deodorant Jethro Poling) MISC Apply 1 application topically daily as needed.    . simvastatin (ZOCOR) 20 MG tablet Take 20 mg by mouth daily after supper.    . simvastatin (ZOCOR) 40 MG tablet Take 20 mg by mouth daily.      No current facility-administered medications for this encounter.      ALLERGIES: Review of patient's allergies indicates no known allergies.   LABORATORY DATA:  Lab Results  Component Value Date   WBC 11.9 (H) 08/29/2016   HGB 11.6 08/29/2016   HCT 35.4 08/29/2016   MCV 92.8 08/29/2016   PLT 259 08/29/2016   Lab Results  Component Value Date   NA 141 08/29/2016   K 4.0 08/29/2016   CL 106 06/23/2016   CO2 25 08/29/2016   Lab Results  Component Value Date   ALT 20 08/29/2016   AST 14 08/29/2016   ALKPHOS 91 08/29/2016   BILITOT 0.97 08/29/2016     NARRATIVE: Mercedes Nelson was seen today for weekly treatment management. The chart was checked and the patient's films were reviewed.  The pateint was seen yesterday at the end of the block 5/5 by Dr. Tammi Klippel. No changes  No vitals taken.  PHYSICAL EXAMINATION: vitals were not taken for this visit.  Alert and oriented x3. In no acute distress.  ASSESSMENT:  The patient is doing satisfactorily with treatment.  PLAN: We will continue with the patient's radiation treatment as planned.  ------------------------------------------------  Jodelle Gross, MD, PhD  This document serves as a record of services personally performed by Kyung Rudd, MD. It was created on his behalf by Darcus Austin, a trained medical scribe. The creation of this record is based on the scribe's personal observations and the provider's statements to them. This document has been checked and approved by the attending provider.

## 2016-10-13 NOTE — Progress Notes (Signed)
pateint seen yesterday end of the block 5/5,  No changes  No vitals taken,  12:54 PM

## 2016-10-16 ENCOUNTER — Ambulatory Visit
Admission: RE | Admit: 2016-10-16 | Discharge: 2016-10-16 | Disposition: A | Discharge status: Home / Self Care | Payer: PPO | Source: Ambulatory Visit | Attending: Radiation Oncology | Admitting: Radiation Oncology

## 2016-10-16 DIAGNOSIS — C50411 Malignant neoplasm of upper-outer quadrant of right female breast: Secondary | ICD-10-CM | POA: Diagnosis not present

## 2016-10-16 DIAGNOSIS — C50911 Malignant neoplasm of unspecified site of right female breast: Secondary | ICD-10-CM | POA: Diagnosis not present

## 2016-10-17 ENCOUNTER — Ambulatory Visit
Admission: RE | Admit: 2016-10-17 | Discharge: 2016-10-17 | Disposition: A | Discharge status: Home / Self Care | Payer: PPO | Source: Ambulatory Visit | Attending: Radiation Oncology | Admitting: Radiation Oncology

## 2016-10-17 DIAGNOSIS — C50411 Malignant neoplasm of upper-outer quadrant of right female breast: Secondary | ICD-10-CM | POA: Diagnosis not present

## 2016-10-17 DIAGNOSIS — C50911 Malignant neoplasm of unspecified site of right female breast: Secondary | ICD-10-CM | POA: Diagnosis not present

## 2016-10-18 ENCOUNTER — Ambulatory Visit
Admission: RE | Admit: 2016-10-18 | Discharge: 2016-10-18 | Disposition: A | Discharge status: Home / Self Care | Payer: PPO | Source: Ambulatory Visit | Attending: Radiation Oncology | Admitting: Radiation Oncology

## 2016-10-18 DIAGNOSIS — C50911 Malignant neoplasm of unspecified site of right female breast: Secondary | ICD-10-CM | POA: Diagnosis not present

## 2016-10-18 DIAGNOSIS — C50411 Malignant neoplasm of upper-outer quadrant of right female breast: Secondary | ICD-10-CM | POA: Diagnosis not present

## 2016-10-19 ENCOUNTER — Ambulatory Visit
Admission: RE | Admit: 2016-10-19 | Discharge: 2016-10-19 | Disposition: A | Discharge status: Home / Self Care | Payer: PPO | Source: Ambulatory Visit | Attending: Radiation Oncology | Admitting: Radiation Oncology

## 2016-10-19 DIAGNOSIS — C50411 Malignant neoplasm of upper-outer quadrant of right female breast: Secondary | ICD-10-CM | POA: Diagnosis not present

## 2016-10-19 DIAGNOSIS — C50911 Malignant neoplasm of unspecified site of right female breast: Secondary | ICD-10-CM | POA: Diagnosis not present

## 2016-10-20 ENCOUNTER — Encounter: Payer: Self-pay | Admitting: Radiation Oncology

## 2016-10-20 ENCOUNTER — Ambulatory Visit
Admission: RE | Admit: 2016-10-20 | Discharge: 2016-10-20 | Disposition: A | Discharge status: Home / Self Care | Payer: PPO | Source: Ambulatory Visit | Attending: Radiation Oncology | Admitting: Radiation Oncology

## 2016-10-20 VITALS — BP 110/67 | HR 59 | Temp 98.0°F | Resp 20 | Wt 178.4 lb

## 2016-10-20 DIAGNOSIS — C50411 Malignant neoplasm of upper-outer quadrant of right female breast: Secondary | ICD-10-CM

## 2016-10-20 DIAGNOSIS — Z170 Estrogen receptor positive status [ER+]: Principal | ICD-10-CM

## 2016-10-20 DIAGNOSIS — C50911 Malignant neoplasm of unspecified site of right female breast: Secondary | ICD-10-CM | POA: Diagnosis not present

## 2016-10-20 NOTE — Progress Notes (Signed)
   Department of Radiation Oncology  Phone:  [phone] Fax:        [phone]  Weekly Treatment Note    Name: Mercedes Nelson Date: 10/20/2016 MRN: [id number] DOB: Apr 10, 1951   Diagnosis:     ICD-9-CM ICD-10-CM   1. Malignant neoplasm of upper-outer quadrant of right breast in female, estrogen receptor positive ([place]) 174.4 C50.411    V86.0 Z17.0      Current dose: 27.5 Gy  Current fraction: 11   MEDICATIONS: Current Outpatient Prescriptions  Medication Sig Dispense Refill  . hyaluronate sodium (RADIAPLEXRX) GEL Apply 1 application topically 2 (two) times daily.    . Multiple Vitamin (MULTIVITAMIN) tablet Take 1 tablet by mouth daily.    . naproxen sodium (ANAPROX) 220 MG tablet Take 220 mg by mouth as needed (pain).     . non-metallic deodorant Jethro Poling) MISC Apply 1 application topically daily as needed.    . simvastatin (ZOCOR) 20 MG tablet Take 20 mg by mouth daily after supper.    . simvastatin (ZOCOR) 40 MG tablet Take 20 mg by mouth daily.      No current facility-administered medications for this encounter.      ALLERGIES: Review of patient's allergies indicates no known allergies.   LABORATORY DATA:  Lab Results  Component Value Date   WBC 11.9 (H) 08/29/2016   HGB 11.6 08/29/2016   HCT 35.4 08/29/2016   MCV 92.8 08/29/2016   PLT 259 08/29/2016   Lab Results  Component Value Date   NA 141 08/29/2016   K 4.0 08/29/2016   CL 106 06/23/2016   CO2 25 08/29/2016   Lab Results  Component Value Date   ALT 20 08/29/2016   AST 14 08/29/2016   ALKPHOS 91 08/29/2016   BILITOT 0.97 08/29/2016     NARRATIVE: FAUSTINA GIRTZ was seen today for weekly treatment management. The chart was checked and the patient's films were reviewed.  Weekly rad txs right breast 11/20 completed, very mild erythema, skin intact, using radiaplex  bid , appetite god, no c/o pain, energy level good 12:48 PM BP 110/67 (BP Location: Left Arm, Patient Position: Sitting, Cuff  Size: Normal)   Pulse (!) 59   Temp 98 F (36.7 C) (Oral)   Resp 20   Wt 178 lb 6.4 oz (80.9 kg)   LMP  (LMP Unknown)   BMI 27.94 kg/m   Wt Readings from Last 3 Encounters:  10/20/16 178 lb 6.4 oz (80.9 kg)  10/12/16 179 lb 3.2 oz (81.3 kg)  09/18/16 187 lb 4.8 oz (85 kg)    PHYSICAL EXAMINATION: weight is 178 lb 6.4 oz (80.9 kg). Her oral temperature is 98 F (36.7 C). Her blood pressure is 110/67 and her pulse is 59 (abnormal). Her respiration is 20.      Mild erythema in the treatment area  ASSESSMENT: The patient is doing satisfactorily with treatment.  PLAN: We will continue with the patient's radiation treatment as planned.

## 2016-10-20 NOTE — Progress Notes (Addendum)
Weekly rad txs right breast 11/20 completed, very mild erythema, skin intact, using radiaplex  bid , appetite god, no c/o pain, energy level good 12:08 PM BP 110/67 (BP Location: Left Arm, Patient Position: Sitting, Cuff Size: Normal)   Pulse (!) 59   Temp 98 F (36.7 C) (Oral)   Resp 20   Wt 178 lb 6.4 oz (80.9 kg)   LMP  (LMP Unknown)   BMI 27.94 kg/m   Wt Readings from Last 3 Encounters:  10/20/16 178 lb 6.4 oz (80.9 kg)  10/12/16 179 lb 3.2 oz (81.3 kg)  09/18/16 187 lb 4.8 oz (85 kg)

## 2016-10-23 ENCOUNTER — Ambulatory Visit
Admission: RE | Admit: 2016-10-23 | Discharge: 2016-10-23 | Disposition: A | Discharge status: Home / Self Care | Payer: PPO | Source: Ambulatory Visit | Attending: Radiation Oncology | Admitting: Radiation Oncology

## 2016-10-23 DIAGNOSIS — C50911 Malignant neoplasm of unspecified site of right female breast: Secondary | ICD-10-CM | POA: Diagnosis not present

## 2016-10-23 DIAGNOSIS — C50411 Malignant neoplasm of upper-outer quadrant of right female breast: Secondary | ICD-10-CM | POA: Diagnosis not present

## 2016-10-24 ENCOUNTER — Ambulatory Visit
Admission: RE | Admit: 2016-10-24 | Discharge: 2016-10-24 | Disposition: A | Discharge status: Home / Self Care | Payer: PPO | Source: Ambulatory Visit | Attending: Radiation Oncology | Admitting: Radiation Oncology

## 2016-10-24 DIAGNOSIS — C50411 Malignant neoplasm of upper-outer quadrant of right female breast: Secondary | ICD-10-CM | POA: Diagnosis not present

## 2016-10-24 DIAGNOSIS — C50911 Malignant neoplasm of unspecified site of right female breast: Secondary | ICD-10-CM | POA: Diagnosis not present

## 2016-10-25 ENCOUNTER — Ambulatory Visit
Admission: RE | Admit: 2016-10-25 | Discharge: 2016-10-25 | Disposition: A | Discharge status: Home / Self Care | Payer: PPO | Source: Ambulatory Visit | Attending: Radiation Oncology | Admitting: Radiation Oncology

## 2016-10-25 ENCOUNTER — Telehealth: Payer: Self-pay | Admitting: *Deleted

## 2016-10-25 DIAGNOSIS — C50911 Malignant neoplasm of unspecified site of right female breast: Secondary | ICD-10-CM | POA: Diagnosis not present

## 2016-10-25 DIAGNOSIS — C50411 Malignant neoplasm of upper-outer quadrant of right female breast: Secondary | ICD-10-CM | POA: Diagnosis not present

## 2016-10-25 NOTE — Telephone Encounter (Signed)
error 

## 2016-10-26 ENCOUNTER — Ambulatory Visit
Admission: RE | Admit: 2016-10-26 | Discharge: 2016-10-26 | Disposition: A | Discharge status: Home / Self Care | Payer: PPO | Source: Ambulatory Visit | Attending: Radiation Oncology | Admitting: Radiation Oncology

## 2016-10-26 DIAGNOSIS — C50411 Malignant neoplasm of upper-outer quadrant of right female breast: Secondary | ICD-10-CM | POA: Diagnosis not present

## 2016-10-26 DIAGNOSIS — C50911 Malignant neoplasm of unspecified site of right female breast: Secondary | ICD-10-CM | POA: Diagnosis not present

## 2016-10-27 ENCOUNTER — Ambulatory Visit
Admission: RE | Admit: 2016-10-27 | Discharge: 2016-10-27 | Disposition: A | Discharge status: Home / Self Care | Payer: PPO | Source: Ambulatory Visit | Attending: Radiation Oncology | Admitting: Radiation Oncology

## 2016-10-27 DIAGNOSIS — C50911 Malignant neoplasm of unspecified site of right female breast: Secondary | ICD-10-CM | POA: Diagnosis not present

## 2016-10-27 DIAGNOSIS — C50411 Malignant neoplasm of upper-outer quadrant of right female breast: Secondary | ICD-10-CM | POA: Diagnosis not present

## 2016-10-30 ENCOUNTER — Ambulatory Visit: Payer: PPO

## 2016-10-30 ENCOUNTER — Ambulatory Visit: Payer: PPO | Admitting: Radiation Oncology

## 2016-10-30 ENCOUNTER — Ambulatory Visit
Admission: RE | Admit: 2016-10-30 | Discharge: 2016-10-30 | Disposition: A | Discharge status: Home / Self Care | Payer: PPO | Source: Ambulatory Visit | Attending: Radiation Oncology | Admitting: Radiation Oncology

## 2016-10-30 DIAGNOSIS — C50411 Malignant neoplasm of upper-outer quadrant of right female breast: Secondary | ICD-10-CM | POA: Diagnosis not present

## 2016-10-30 DIAGNOSIS — C50911 Malignant neoplasm of unspecified site of right female breast: Secondary | ICD-10-CM | POA: Diagnosis not present

## 2016-10-31 ENCOUNTER — Ambulatory Visit
Admission: RE | Admit: 2016-10-31 | Discharge: 2016-10-31 | Disposition: A | Discharge status: Home / Self Care | Payer: PPO | Source: Ambulatory Visit | Attending: Radiation Oncology | Admitting: Radiation Oncology

## 2016-10-31 ENCOUNTER — Ambulatory Visit: Payer: PPO

## 2016-10-31 DIAGNOSIS — C50411 Malignant neoplasm of upper-outer quadrant of right female breast: Secondary | ICD-10-CM | POA: Diagnosis not present

## 2016-10-31 DIAGNOSIS — C50911 Malignant neoplasm of unspecified site of right female breast: Secondary | ICD-10-CM | POA: Diagnosis not present

## 2016-11-01 ENCOUNTER — Ambulatory Visit
Admission: RE | Admit: 2016-11-01 | Discharge: 2016-11-01 | Disposition: A | Discharge status: Home / Self Care | Payer: PPO | Source: Ambulatory Visit | Attending: Radiation Oncology | Admitting: Radiation Oncology

## 2016-11-01 ENCOUNTER — Encounter: Payer: Self-pay | Admitting: Radiation Oncology

## 2016-11-01 ENCOUNTER — Ambulatory Visit: Payer: PPO

## 2016-11-01 VITALS — BP 125/59 | HR 66 | Temp 98.7°F | Resp 20 | Wt 175.8 lb

## 2016-11-01 DIAGNOSIS — C50911 Malignant neoplasm of unspecified site of right female breast: Secondary | ICD-10-CM | POA: Diagnosis not present

## 2016-11-01 DIAGNOSIS — Z170 Estrogen receptor positive status [ER+]: Principal | ICD-10-CM

## 2016-11-01 DIAGNOSIS — C50411 Malignant neoplasm of upper-outer quadrant of right female breast: Secondary | ICD-10-CM

## 2016-11-01 NOTE — Progress Notes (Signed)
   Department of Radiation Oncology  Phone:  [phone] Fax:        [phone]  Weekly Treatment Note    Name: Mercedes Nelson Date: 11/01/2016 MRN: [id number] DOB: 15-Nov-1951   Diagnosis:     ICD-9-CM ICD-10-CM   1. Malignant neoplasm of upper-outer quadrant of right breast in female, estrogen receptor positive ([place]) 174.4 C50.411    V86.0 Z17.0      Current dose: 47.5 Gy  Current fraction: 19   MEDICATIONS: Current Outpatient Prescriptions  Medication Sig Dispense Refill  . hyaluronate sodium (RADIAPLEXRX) GEL Apply 1 application topically 2 (two) times daily.    . Multiple Vitamin (MULTIVITAMIN) tablet Take 1 tablet by mouth daily.    . naproxen sodium (ANAPROX) 220 MG tablet Take 220 mg by mouth as needed (pain).     . non-metallic deodorant Jethro Poling) MISC Apply 1 application topically daily as needed.    . simvastatin (ZOCOR) 20 MG tablet Take 20 mg by mouth daily after supper.    . simvastatin (ZOCOR) 40 MG tablet Take 20 mg by mouth daily.      No current facility-administered medications for this encounter.      ALLERGIES: Patient has no known allergies.   LABORATORY DATA:  Lab Results  Component Value Date   WBC 11.9 (H) 08/29/2016   HGB 11.6 08/29/2016   HCT 35.4 08/29/2016   MCV 92.8 08/29/2016   PLT 259 08/29/2016   Lab Results  Component Value Date   NA 141 08/29/2016   K 4.0 08/29/2016   CL 106 06/23/2016   CO2 25 08/29/2016   Lab Results  Component Value Date   ALT 20 08/29/2016   AST 14 08/29/2016   ALKPHOS 91 08/29/2016   BILITOT 0.97 08/29/2016     NARRATIVE: Mercedes Nelson was seen today for weekly treatment management. The chart was checked and the patient's films were reviewed.  Patient reports no pain, only some mild tenderness in the treatment area. Per nursing, mild erythema noted in the treatment area. Patient reports using radiaplex as directed.  9:43 PM BP (!) 125/59 (BP Location: Left Arm, Patient Position: Sitting,  Cuff Size: Normal)   Pulse 66   Temp 98.7 F (37.1 C) (Oral)   Resp 20   Wt 175 lb 12.8 oz (79.7 kg)   LMP  (LMP Unknown)   BMI 27.53 kg/m   Wt Readings from Last 3 Encounters:  11/01/16 175 lb 12.8 oz (79.7 kg)  10/20/16 178 lb 6.4 oz (80.9 kg)  10/12/16 179 lb 3.2 oz (81.3 kg)    PHYSICAL EXAMINATION: weight is 175 lb 12.8 oz (79.7 kg). Her oral temperature is 98.7 F (37.1 C). Her blood pressure is 125/59 (abnormal) and her pulse is 66. Her respiration is 20.      Mild erythema in the treatment area.  ASSESSMENT: The patient is doing satisfactorily with treatment.  PLAN: We will continue with the patient's radiation treatment as planned. We discussed using Vitamin E oil when the radiaplex cream is finished.  ------------------------------------------------  Jodelle Gross, MD, PhD    This document serves as a record of services personally performed by Kyung Rudd, MD. It was created on his behalf by Maryla Morrow, a trained medical scribe. The creation of this record is based on the scribe's personal observations and the provider's statements to them. This document has been checked and approved by the attending provider.

## 2016-11-01 NOTE — Progress Notes (Signed)
Weekly rad txs 19/20 right breast, mild erythema rash also,  Skin intact, no pain,some tenderness, no itching, gave 1 month follow up appt with Bryson Ha, interested   In [facility] support group,appetie, will start Tamoxifen  Soon 10:57 AM BP (!) 125/59 (BP Location: Left Arm, Patient Position: Sitting, Cuff Size: Normal)   Pulse 66   Temp 98.7 F (37.1 C) (Oral)   Resp 20   Wt 175 lb 12.8 oz (79.7 kg)   LMP  (LMP Unknown)   BMI 27.53 kg/m   Wt Readings from Last 3 Encounters:  11/01/16 175 lb 12.8 oz (79.7 kg)  10/20/16 178 lb 6.4 oz (80.9 kg)  10/12/16 179 lb 3.2 oz (81.3 kg)

## 2016-11-01 NOTE — Progress Notes (Signed)
  Radiation Oncology         [phone]) [phone] ________________________________  Name: Mercedes Nelson MRN: [id number]  Date: 10/12/2016  DOB: Sep 03, 1951  Complex simulation note  The patient has undergone complex simulation for her upcoming boost treatment for her diagnosis of right sided breast cancer. The patient has initially been planned to receive 42.5 Gy. The patient will now receive a 7.5 Gy boost to the seroma cavity which has been contoured. This will be accomplished using an en face electron field. Based on the depth of the target area, 15 MeV electrons will be used. The patient's final total dose therefore will be 50 Gy. A complex isodose plan from the electron [facility] Carlo calculation is requested for the boost treatment.   _______________________________  Jodelle Gross, MD, PhD

## 2016-11-02 ENCOUNTER — Telehealth: Payer: Self-pay | Admitting: *Deleted

## 2016-11-02 ENCOUNTER — Ambulatory Visit
Admission: RE | Admit: 2016-11-02 | Discharge: 2016-11-02 | Disposition: A | Discharge status: Home / Self Care | Payer: PPO | Source: Ambulatory Visit | Attending: Radiation Oncology | Admitting: Radiation Oncology

## 2016-11-02 ENCOUNTER — Encounter: Payer: Self-pay | Admitting: Radiation Oncology

## 2016-11-02 DIAGNOSIS — C50411 Malignant neoplasm of upper-outer quadrant of right female breast: Secondary | ICD-10-CM | POA: Diagnosis not present

## 2016-11-02 DIAGNOSIS — C50911 Malignant neoplasm of unspecified site of right female breast: Secondary | ICD-10-CM | POA: Diagnosis not present

## 2016-11-02 NOTE — Telephone Encounter (Signed)
  Oncology Nurse Navigator Documentation  Navigator Location: [facility] (11/02/16 1400)   )Navigator Encounter Type: Telephone (11/02/16 1400) Telephone: [place] Call (11/02/16 1400)                   Patient Visit Type: [id number] (11/02/16 1400) Treatment Phase: Final Radiation Tx (11/02/16 1400)                            Time Spent with Patient: 15 (11/02/16 1400)

## 2016-11-25 NOTE — Progress Notes (Signed)
  Radiation Oncology         [phone]) [phone] ________________________________  Name: MELISSIA SALAIZ MRN: [id number]  Date: 11/02/2016  DOB: [birth year]  End of Treatment Note  Diagnosis:   right-sided breast cancer     Indication for treatment:  Curative       Radiation treatment dates:   10/05/2016 through 11/02/2016  Site/dose:   The patient initially received a dose of 42.5 Gy in 17 fractions to the breast using whole-breast tangent fields. This was delivered using a 3-D conformal technique. The patient then received a boost to the seroma. This delivered an additional 7.5 Gy in 3 fractions using an en face electron field due to the depth of the seroma. The total dose was 50 Gy.  Narrative: The patient tolerated radiation treatment relatively well.   The patient had some expected skin irritation as she progressed during treatment. Moist desquamation was not present at the end of treatment.  Plan: The patient has completed radiation treatment. The patient will return to radiation oncology clinic for routine followup in one month. I advised the patient to call or return sooner if they have any questions or concerns related to their recovery or treatment. ________________________________  Jodelle Gross, M.D., Ph.D.

## 2016-11-27 NOTE — Assessment & Plan Note (Signed)
Right lumpectomy 05/26/2016: IDC with DCIS 1.6 cm, margins, 0/2 lymph nodes negative, ER 100%, PR 20%, Ki-67 15%, HER-2 negative ratio 1.0 T1 BN 0 stage IA pathologic stage Oncotype DX 26, 17% risk of recurrence  Treatment Summary: 1. Adjuvant chemotherapy with Taxotere and Cytoxan X 4 06/27/16 to 9/12/17followed by 2. Adjuvant radiation therapy 10/05/16 to 11/02/16 3. Adjuvant antiestrogen therapy with Letrozole 2.5 mg daily ------------------------------------------------------------------------------------------------------------------------------------------ Letrozole Counseling: We discussed the risks and benefits of anti-estrogen therapy with aromatase inhibitors. These include but not limited to insomnia, hot flashes, mood changes, vaginal dryness, bone density loss, and weight gain. We strongly believe that the benefits far outweigh the risks. Patient understands these risks and consented to starting treatment. Planned treatment duration is 5 years.  RTC in 3 months

## 2016-11-28 ENCOUNTER — Ambulatory Visit (HOSPITAL_BASED_OUTPATIENT_CLINIC_OR_DEPARTMENT_OTHER): Payer: PPO | Admitting: Hematology and Oncology

## 2016-11-28 ENCOUNTER — Telehealth: Payer: Self-pay | Admitting: Hematology and Oncology

## 2016-11-28 ENCOUNTER — Encounter: Payer: Self-pay | Admitting: Hematology and Oncology

## 2016-11-28 ENCOUNTER — Other Ambulatory Visit (HOSPITAL_BASED_OUTPATIENT_CLINIC_OR_DEPARTMENT_OTHER): Payer: PPO

## 2016-11-28 DIAGNOSIS — C50411 Malignant neoplasm of upper-outer quadrant of right female breast: Secondary | ICD-10-CM | POA: Diagnosis not present

## 2016-11-28 DIAGNOSIS — Z170 Estrogen receptor positive status [ER+]: Secondary | ICD-10-CM | POA: Diagnosis not present

## 2016-11-28 LAB — COMPREHENSIVE METABOLIC PANEL
ALT: 21 U/L (ref 0–55)
ANION GAP: 9 meq/L (ref 3–11)
AST: 19 U/L (ref 5–34)
Albumin: 3.6 g/dL (ref 3.5–5.0)
Alkaline Phosphatase: 79 U/L (ref 40–150)
BUN: 16.5 mg/dL (ref 7.0–26.0)
CHLORIDE: 106 meq/L (ref 98–109)
CO2: 27 meq/L (ref 22–29)
Calcium: 9.3 mg/dL (ref 8.4–10.4)
Creatinine: 0.7 mg/dL (ref 0.6–1.1)
EGFR: 86 mL/min/{1.73_m2} — AB (ref >90)
Glucose: 63 mg/dl — ABNORMAL LOW (ref 70–140)
POTASSIUM: 4.1 meq/L (ref 3.5–5.1)
Sodium: 142 mEq/L (ref 136–145)
Total Bilirubin: 0.97 mg/dL (ref 0.20–1.20)
Total Protein: 6.4 g/dL (ref 6.4–8.3)

## 2016-11-28 LAB — CBC WITH DIFFERENTIAL/PLATELET
BASO%: 0.2 % (ref 0.0–2.0)
Basophils Absolute: 0 10*3/uL (ref 0.0–0.1)
EOS ABS: 0.1 10*3/uL (ref 0.0–0.5)
EOS%: 1.7 % (ref 0.0–7.0)
HCT: 41.7 % (ref 34.8–46.6)
HGB: 13.6 g/dL (ref 11.6–15.9)
LYMPH%: 19.5 % (ref 14.0–49.7)
MCH: 29.5 pg (ref 25.1–34.0)
MCHC: 32.6 g/dL (ref 31.5–36.0)
MCV: 90.5 fL (ref 79.5–101.0)
MONO#: 0.3 10*3/uL (ref 0.1–0.9)
MONO%: 8.1 % (ref 0.0–14.0)
NEUT#: 3 10*3/uL (ref 1.5–6.5)
NEUT%: 70.5 % (ref 38.4–76.8)
PLATELETS: 157 10*3/uL (ref 145–400)
RBC: 4.61 10*6/uL (ref 3.70–5.45)
RDW: 12.2 % (ref 11.2–14.5)
WBC: 4.2 10*3/uL (ref 3.9–10.3)
lymph#: 0.8 10*3/uL — ABNORMAL LOW (ref 0.9–3.3)

## 2016-11-28 MED ORDER — LETROZOLE 2.5 MG PO TABS: 2.5000 mg | ORAL_TABLET | Freq: Every day | ORAL | 3 refills | Status: DC

## 2016-11-28 NOTE — Telephone Encounter (Signed)
Per [facility], Patient's Mammogram is scheduled for 04/06/17 @ 10 a.m. . Mammogram appointment was confirmed with patient by myself.  Follow up appointments scheduled per 11/28/16 los. A copy of the AVS report and appointment schedule was given to the patient, per 11/28/16 los.

## 2016-11-28 NOTE — Progress Notes (Signed)
Patient Care Team: Dineen Kid, MD as PCP - General (Family Medicine)  DIAGNOSIS:  Encounter Diagnosis  Name Primary?  . Malignant neoplasm of upper-outer quadrant of right breast in female, estrogen receptor positive ([place])     SUMMARY OF ONCOLOGIC HISTORY:   Malignant neoplasm of upper-outer quadrant of right female breast ([place])   04/05/2016 Mammogram    Asymmetry in the right breast lateral middle depth, 11 mm non-circumscribed mass ([facility] mammogram)      04/05/2016 Initial Diagnosis    Right breast biopsy: Invasive ductal carcinoma ER 90-100%, PR 20%, Ki-67 20%, HER-2 Neg, T1b N0 stage IA clinical stage      05/26/2016 Surgery    Right lumpectomy: IDC with DCIS 1.6 and goes, margins, 0/2 lymph nodes negative, ER 100%, PR 20%, Ki-67 15%, HER-2 negative ratio 1.0 T1 BN 0 stage IA pathologic stage, Oncotype DX 26, 17% ROR      06/27/2016 - 08/29/2016 Chemotherapy    Adjuvant chemotherapy with Taxotere and Cytoxan 4      10/05/2016 - 11/02/2016 Radiation Therapy    Adjuvant XRT      11/28/2016 -  Anti-estrogen oral therapy    Letrozole 1 mg daily       CHIEF COMPLIANT: Follow-up after radiation therapy to begin antiestrogen therapy  INTERVAL HISTORY: Mercedes Nelson is a 65 year old with above-mentioned history of right breast cancer who completed lumpectomy followed by adjuvant chemotherapy and radiation. She is recovered very well from the effects of radiation. She is here to discuss starting antiestrogen therapy. She does not have any further side effects from radiation. 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 Fatigue has improved.  REVIEW OF SYSTEMS:   Constitutional: Denies fevers, chills or abnormal weight loss Eyes: Denies blurriness of vision Ears, nose, mouth, throat, and face: Denies mucositis or sore throat Respiratory: Denies cough, dyspnea or wheezes Cardiovascular: Denies palpitation, chest discomfort Gastrointestinal:  Denies nausea, heartburn or change in bowel  habits Skin: Denies abnormal skin rashes Lymphatics: Denies new lymphadenopathy or easy bruising Neurological:Denies numbness, tingling or new weaknesses Behavioral/Psych: Mood is stable, no new changes  Extremities: No lower extremity edema Breast:  denies any pain or lumps or nodules in either breasts All other systems were reviewed with the patient and are negative.  I have reviewed the past medical history, past surgical history, social history and family history with the patient and they are unchanged from previous note.  ALLERGIES:  has No Known Allergies.  MEDICATIONS:  Current Outpatient Prescriptions  Medication Sig Dispense Refill  . hyaluronate sodium (RADIAPLEXRX) GEL Apply 1 application topically 2 (two) times daily.    . Multiple Vitamin (MULTIVITAMIN) tablet Take 1 tablet by mouth daily.    . naproxen sodium (ANAPROX) 220 MG tablet Take 220 mg by mouth as needed (pain).     . non-metallic deodorant Jethro Poling) MISC Apply 1 application topically daily as needed.    . simvastatin (ZOCOR) 20 MG tablet Take 20 mg by mouth daily after supper.    . simvastatin (ZOCOR) 40 MG tablet Take 20 mg by mouth daily.      No current facility-administered medications for this visit.     PHYSICAL EXAMINATION: ECOG PERFORMANCE STATUS: 1 - Symptomatic but completely ambulatory  Vitals:   11/28/16 0916  BP: 129/60  Pulse: (!) 57  Resp: 18  Temp: 97.6 F (36.4 C)   Filed Weights   11/28/16 0916  Weight: 178 lb 12.8 oz (81.1 kg)    GENERAL:alert, no distress and comfortable SKIN:  skin color, texture, turgor are normal, no rashes or significant lesions EYES: normal, Conjunctiva are pink and non-injected, sclera clear OROPHARYNX:no exudate, no erythema and lips, buccal mucosa, and tongue normal  NECK: supple, thyroid normal size, non-tender, without nodularity LYMPH:  no palpable lymphadenopathy in the cervical, axillary or inguinal LUNGS: clear to auscultation and percussion with  normal breathing effort HEART: regular rate & rhythm and no murmurs and no lower extremity edema ABDOMEN:abdomen soft, non-tender and normal bowel sounds MUSCULOSKELETAL:no cyanosis of digits and no clubbing  NEURO: alert & oriented x 3 with fluent speech, no focal motor/sensory deficits EXTREMITIES: No lower extremity edema  LABORATORY DATA:  I have reviewed the data as listed   Chemistry      Component Value Date/Time   NA 141 08/29/2016 0827   K 4.0 08/29/2016 0827   CL 106 06/23/2016 1530   CO2 25 08/29/2016 0827   BUN 12.7 08/29/2016 0827   CREATININE 0.7 08/29/2016 0827      Component Value Date/Time   CALCIUM 9.4 08/29/2016 0827   ALKPHOS 91 08/29/2016 0827   AST 14 08/29/2016 0827   ALT 20 08/29/2016 0827   BILITOT 0.97 08/29/2016 0827       Lab Results  Component Value Date   WBC 4.2 11/28/2016   HGB 13.6 11/28/2016   HCT 41.7 11/28/2016   MCV 90.5 11/28/2016   PLT 157 11/28/2016   NEUTROABS 3.0 11/28/2016    ASSESSMENT & PLAN:  Malignant neoplasm of upper-outer quadrant of right female breast ([place]) Right lumpectomy 05/26/2016: IDC with DCIS 1.6 cm, margins, 0/2 lymph nodes negative, ER 100%, PR 20%, Ki-67 15%, HER-2 negative ratio 1.0 T1 BN 0 stage IA pathologic stage Oncotype DX 26, 17% risk of recurrence  Treatment Summary: 1. Adjuvant chemotherapy with Taxotere and Cytoxan X 4 06/27/16 to 9/12/17followed by 2. Adjuvant radiation therapy 10/05/16 to 11/02/16 3. Adjuvant antiestrogen therapy with Letrozole 2.5 mg daily Started 11/28/2016 ------------------------------------------------------------------------------------------------------------------------------------------ Letrozole Counseling: We discussed the risks and benefits of anti-estrogen therapy with aromatase inhibitors. These include but not limited to insomnia, hot flashes, mood changes, vaginal dryness, bone density loss, and weight gain. We strongly believe that the benefits far outweigh the  risks. Patient understands these risks and consented to starting treatment. Planned treatment duration is 5 years.  RTC in 3 months   No orders of the defined types were placed in this encounter.  The patient has a good understanding of the overall plan. she agrees with it. she will call with any problems that may develop before the next visit here.   Rulon Eisenmenger, MD 11/28/16

## 2016-12-15 NOTE — Progress Notes (Signed)
[place] is here for a one month follow up appointment for malignant neoplasm of upper-outer quadrant of right breast in female, estrogen receptor positive one month FU.  Skin status:Right breast has normal skin color.  Denies any tenderness to her breast. What lotion are you using? Using lotion with vitamin E. Have you seen med onc? If not, when is appointment: 11-28-16 Dr. Lindi Adie next 02-26-17 If they are ER+, have they started Al or Tamoxifen? If not, why? 11-28-16 Letrozole 11-28-16 Discuss survivorship appointment. Not scheduled yet Have you had a mammogram scheduled? Not scheduled yet Offer referral to [facility]. Will receive at the survivorship appointment. Oncotype Dx. Score:Oncotype DX 26, 17% risk of recurrence Appetite:Good Pain:None Fatigue:Having fatigue has been averaging 4 hours of sleep due to night hot flashes. Arm mobility:Able to raise right arm without difficulty Lymphedema:None Wt Readings from Last 3 Encounters:  12/26/16 (!) 479 lb 9.6 oz (217.5 kg)  11/28/16 178 lb 12.8 oz (81.1 kg)  11/01/16 175 lb 12.8 oz (79.7 kg)  BP 126/70   Pulse 68   Temp 98.3 F (36.8 C) (Oral)   Resp 18   Ht 5\' 7"  (1.702 m)   Wt (!) 479 lb 9.6 oz (217.5 kg)   LMP  (LMP Unknown)   SpO2 100%   BMI 75.12 kg/m

## 2016-12-15 NOTE — Progress Notes (Deleted)
[place]. Mercedes Nelson is here for a one month follow up appointment for  Skin status: What lotion are you using?  Have you seen med onc? If not, when is appointment: If they are ER+, have they started Al or Tamoxifen? If not, why?  Discuss survivorship appointment.  Have you had a mammogram scheduled? Not scheduled yet Offer referral to [facility]. Will receive at the survivorship appointment. Oncotype Dx. Score: Appetite: Pain: Fatigue: Arm mobility:

## 2016-12-22 DIAGNOSIS — C50411 Malignant neoplasm of upper-outer quadrant of right female breast: Secondary | ICD-10-CM | POA: Diagnosis not present

## 2016-12-22 DIAGNOSIS — N6489 Other specified disorders of breast: Secondary | ICD-10-CM | POA: Diagnosis not present

## 2016-12-22 DIAGNOSIS — E049 Nontoxic goiter, unspecified: Secondary | ICD-10-CM | POA: Diagnosis not present

## 2016-12-22 DIAGNOSIS — E785 Hyperlipidemia, unspecified: Secondary | ICD-10-CM | POA: Diagnosis not present

## 2016-12-26 ENCOUNTER — Encounter: Payer: Self-pay | Admitting: Radiation Oncology

## 2016-12-26 ENCOUNTER — Ambulatory Visit
Admission: RE | Admit: 2016-12-26 | Discharge: 2016-12-26 | Disposition: A | Discharge status: Home / Self Care | Payer: PPO | Source: Ambulatory Visit | Attending: Radiation Oncology | Admitting: Radiation Oncology

## 2016-12-26 VITALS — BP 126/70 | HR 68 | Temp 98.3°F | Resp 18 | Ht 67.0 in | Wt >= 6400 oz

## 2016-12-26 DIAGNOSIS — Z170 Estrogen receptor positive status [ER+]: Secondary | ICD-10-CM | POA: Diagnosis not present

## 2016-12-26 DIAGNOSIS — Z5189 Encounter for other specified aftercare: Secondary | ICD-10-CM | POA: Insufficient documentation

## 2016-12-26 DIAGNOSIS — C50411 Malignant neoplasm of upper-outer quadrant of right female breast: Secondary | ICD-10-CM

## 2016-12-26 DIAGNOSIS — Z923 Personal history of irradiation: Secondary | ICD-10-CM | POA: Insufficient documentation

## 2016-12-26 DIAGNOSIS — C50911 Malignant neoplasm of unspecified site of right female breast: Secondary | ICD-10-CM | POA: Diagnosis not present

## 2016-12-26 NOTE — Progress Notes (Signed)
Radiation Oncology         [phone]) [phone] ________________________________  Name: Mercedes Nelson MRN: [id number]  Date: 12/26/2016  DOB: March 17, 1951  Post Treatment Note  CC: VIA, Lennette Bihari, MD  Fanny Skates, MD  Diagnosis:   Stage IA, T1cN0M0, ER/PR positive invasive ductal carcinoma of the right breast.  Interval Since Last Radiation:  8 weeks   10/05/2016 through 11/02/2016: The patient initially received a dose of 42.5 Gy in 17 fractions to the breast using whole-breast tangent fields. This was delivered using a 3-D conformal technique. The patient then received a boost to the seroma. This delivered an additional 7.5 Gy in 3 fractions using an en face electron field due to the depth of the seroma. The total dose was 50 Gy.  Narrative:  The patient returns today for routine follow-up. During treatment she did very well with radiotherapy and did not have significant desquamation.                             On review of systems, the patient states she's doing great. She has been on Letrazole for just short of one month and reports she's feeling well. She is having some hot flashes since starting this that she had not noticed previously. She denies chest pain, shortness of breath, fevers or chills. She denies joint aches. No other complaints are verbalized.  ALLERGIES:  has No Known Allergies.  Meds: Current Outpatient Prescriptions  Medication Sig Dispense Refill  . letrozole (FEMARA) 2.5 MG tablet Take 1 tablet (2.5 mg total) by mouth daily. 90 tablet 3  . Multiple Vitamin (MULTIVITAMIN) tablet Take 1 tablet by mouth daily.    . naproxen sodium (ANAPROX) 220 MG tablet Take 220 mg by mouth as needed (pain).     . simvastatin (ZOCOR) 20 MG tablet Take 20 mg by mouth daily after supper.     No current facility-administered medications for this encounter.     Physical Findings:  height is 5\' 7"  (1.702 m) and weight is 479 lb 9.6 oz (217.5 kg) (abnormal). Her oral temperature is 98.3 F  (36.8 C). Her blood pressure is 126/70 and her pulse is 68. Her respiration is 18 and oxygen saturation is 100%.  In general this is a well appearing Caucasian female in no acute distress. She's alert and oriented x4 and appropriate throughout the examination. Cardiopulmonary assessment is negative for acute distress and she exhibits normal effort. The right breast was examined and reveals minimal hyperpigmentation without desquamation.  Lab Findings: Lab Results  Component Value Date   WBC 4.2 11/28/2016   HGB 13.6 11/28/2016   HCT 41.7 11/28/2016   MCV 90.5 11/28/2016   PLT 157 11/28/2016     Radiographic Findings: No results found.  Impression/Plan: 1. Stage IA, T1cN0M0, ER/PR positive invasive ductal carcinoma of the right breast. The patient has been doing well since completion of radiotherapy. We discussed that we would be happy to continue to follow her as needed, but she will also continue to follow up with Dr. Lindi Adie in medical oncology. She was counseled on skin care, as well as encouraged to consider sunscreen over the upper chest wall to avoid sunburn.  2. Survivorship. The patient will be referred for survivorship by her breast navigator. 3. Vasomotor symptoms of menopause. I encouraged the patient to discuss her hot flashes with Dr. Lindi Adie to determine if she needs antidepressants or other prescription medication to manage this. 4. Possible  genetic predisposition to malignancy. The patient was encouraged to consider genetic counseling referral given her personal history of breast cancer, and family history of ovarian cancer. She will keep me informed if she would like a referral for this.     Carola Rhine, PAC

## 2016-12-26 NOTE — Addendum Note (Signed)
Encounter addended by: Malena Edman, RN on: 12/26/2016 10:58 AM<BR>    Actions taken: Charge Capture section accepted

## 2016-12-27 ENCOUNTER — Other Ambulatory Visit: Payer: Self-pay | Admitting: Hematology and Oncology

## 2016-12-27 ENCOUNTER — Other Ambulatory Visit: Payer: Self-pay | Admitting: General Surgery

## 2016-12-27 DIAGNOSIS — Z0000 Encounter for general adult medical examination without abnormal findings: Secondary | ICD-10-CM | POA: Diagnosis not present

## 2016-12-27 DIAGNOSIS — N6489 Other specified disorders of breast: Secondary | ICD-10-CM

## 2016-12-27 DIAGNOSIS — Z23 Encounter for immunization: Secondary | ICD-10-CM | POA: Diagnosis not present

## 2017-01-03 ENCOUNTER — Telehealth: Payer: Self-pay | Admitting: Hematology and Oncology

## 2017-01-03 ENCOUNTER — Other Ambulatory Visit: Payer: PPO

## 2017-01-03 NOTE — Telephone Encounter (Signed)
Patient was having lots of hot flashes giving her awake at night. She takes letrozole at bedtime. These symptoms appear to be slowly improving with time according to the patient. I instructed her to change the time of the day that she takes a medication as this might help her if changing the time did not make any difference, then we can decide if we need to switch her to a different medication or use a medicine to help with the hot flashes like Effexor or Neurontin. Instructed the patient to call me if she has further problems.

## 2017-01-09 ENCOUNTER — Other Ambulatory Visit: Payer: Self-pay | Admitting: General Surgery

## 2017-01-09 ENCOUNTER — Ambulatory Visit
Admission: RE | Admit: 2017-01-09 | Discharge: 2017-01-09 | Disposition: A | Discharge status: Home / Self Care | Payer: PPO | Source: Ambulatory Visit | Attending: General Surgery | Admitting: General Surgery

## 2017-01-09 DIAGNOSIS — N6489 Other specified disorders of breast: Secondary | ICD-10-CM

## 2017-01-09 DIAGNOSIS — N6311 Unspecified lump in the right breast, upper outer quadrant: Secondary | ICD-10-CM | POA: Diagnosis not present

## 2017-01-25 DIAGNOSIS — H40013 Open angle with borderline findings, low risk, bilateral: Secondary | ICD-10-CM | POA: Diagnosis not present

## 2017-01-25 DIAGNOSIS — H25013 Cortical age-related cataract, bilateral: Secondary | ICD-10-CM | POA: Diagnosis not present

## 2017-01-25 DIAGNOSIS — H2513 Age-related nuclear cataract, bilateral: Secondary | ICD-10-CM | POA: Diagnosis not present

## 2017-01-30 DIAGNOSIS — H40013 Open angle with borderline findings, low risk, bilateral: Secondary | ICD-10-CM | POA: Diagnosis not present

## 2017-02-19 DIAGNOSIS — E2839 Other primary ovarian failure: Secondary | ICD-10-CM | POA: Diagnosis not present

## 2017-02-19 DIAGNOSIS — M8588 Other specified disorders of bone density and structure, other site: Secondary | ICD-10-CM | POA: Diagnosis not present

## 2017-02-26 ENCOUNTER — Ambulatory Visit (HOSPITAL_BASED_OUTPATIENT_CLINIC_OR_DEPARTMENT_OTHER): Payer: PPO | Admitting: Hematology and Oncology

## 2017-02-26 ENCOUNTER — Encounter: Payer: Self-pay | Admitting: Hematology and Oncology

## 2017-02-26 ENCOUNTER — Other Ambulatory Visit (HOSPITAL_BASED_OUTPATIENT_CLINIC_OR_DEPARTMENT_OTHER): Payer: PPO

## 2017-02-26 DIAGNOSIS — Z170 Estrogen receptor positive status [ER+]: Secondary | ICD-10-CM

## 2017-02-26 DIAGNOSIS — N951 Menopausal and female climacteric states: Secondary | ICD-10-CM

## 2017-02-26 DIAGNOSIS — Z79811 Long term (current) use of aromatase inhibitors: Secondary | ICD-10-CM | POA: Diagnosis not present

## 2017-02-26 DIAGNOSIS — C50411 Malignant neoplasm of upper-outer quadrant of right female breast: Secondary | ICD-10-CM | POA: Diagnosis not present

## 2017-02-26 LAB — COMPREHENSIVE METABOLIC PANEL
ALBUMIN: 4 g/dL (ref 3.5–5.0)
ALT: 21 U/L (ref 0–55)
AST: 19 U/L (ref 5–34)
Alkaline Phosphatase: 79 U/L (ref 40–150)
Anion Gap: 8 mEq/L (ref 3–11)
BUN: 18.3 mg/dL (ref 7.0–26.0)
CO2: 29 mEq/L (ref 22–29)
Calcium: 9.7 mg/dL (ref 8.4–10.4)
Chloride: 106 mEq/L (ref 98–109)
Creatinine: 0.8 mg/dL (ref 0.6–1.1)
EGFR: 82 mL/min/{1.73_m2} — ABNORMAL LOW (ref >90)
GLUCOSE: 70 mg/dL (ref 70–140)
POTASSIUM: 4.5 meq/L (ref 3.5–5.1)
SODIUM: 144 meq/L (ref 136–145)
TOTAL PROTEIN: 6.4 g/dL (ref 6.4–8.3)
Total Bilirubin: 1.18 mg/dL (ref 0.20–1.20)

## 2017-02-26 LAB — CBC WITH DIFFERENTIAL/PLATELET
BASO%: 0.7 % (ref 0.0–2.0)
Basophils Absolute: 0 10*3/uL (ref 0.0–0.1)
EOS%: 1.4 % (ref 0.0–7.0)
Eosinophils Absolute: 0.1 10*3/uL (ref 0.0–0.5)
HCT: 40.8 % (ref 34.8–46.6)
HEMOGLOBIN: 13.6 g/dL (ref 11.6–15.9)
LYMPH%: 18.8 % (ref 14.0–49.7)
MCH: 30.8 pg (ref 25.1–34.0)
MCHC: 33.4 g/dL (ref 31.5–36.0)
MCV: 92.3 fL (ref 79.5–101.0)
MONO#: 0.5 10*3/uL (ref 0.1–0.9)
MONO%: 9.6 % (ref 0.0–14.0)
NEUT%: 69.5 % (ref 38.4–76.8)
NEUTROS ABS: 3.3 10*3/uL (ref 1.5–6.5)
Platelets: 189 10*3/uL (ref 145–400)
RBC: 4.42 10*6/uL (ref 3.70–5.45)
RDW: 13.8 % (ref 11.2–14.5)
WBC: 4.8 10*3/uL (ref 3.9–10.3)
lymph#: 0.9 10*3/uL (ref 0.9–3.3)

## 2017-02-26 NOTE — Progress Notes (Signed)
Patient Care Team: Dineen Kid, MD as PCP - General (Family Medicine)  DIAGNOSIS:  Encounter Diagnosis  Name Primary?  . 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 and goes, margins, 0/2 lymph nodes negative, ER 100%, PR 20%, Ki-67 15%, HER-2 negative ratio 1.0 T1 BN 0 stage IA pathologic stage, Oncotype DX 26, 17% ROR      06/27/2016 - 08/29/2016 Chemotherapy    Adjuvant chemotherapy with Taxotere and Cytoxan 4      10/05/2016 - 11/02/2016 Radiation Therapy    Adjuvant XRT      11/28/2016 -  Anti-estrogen oral therapy    Letrozole 1 mg daily       CHIEF COMPLIANT: Follow-up on anastrozole  INTERVAL HISTORY: Mercedes Nelson is a 66 year old with above-mentioned history of right breast cancer treated with lumpectomy and adjuvant chemotherapy and radiation. She is currently on letrozole therapy. This was started in December 2017. She is here for toxicity evaluation. She complains of lots of hot flashes. These hot flashes don't bother her through the day but at night she is unable to go back to sleep because of this. She tried taking a different times of the day but the symptoms did not improve. She does not have these hot flashes related to menopausal symptoms or related to anastrozole. She also complains of arthritic pain in her joints.  REVIEW OF SYSTEMS:   Constitutional: Denies fevers, chills or abnormal weight loss Eyes: Denies blurriness of vision Ears, nose, mouth,  throat, and face: Denies mucositis or sore throat Respiratory: Denies cough, dyspnea or wheezes Cardiovascular: Denies palpitation, chest discomfort Gastrointestinal:  Denies nausea, heartburn or change in bowel habits Skin: Denies abnormal skin rashes Lymphatics: Denies new lymphadenopathy or easy bruising Neurological:Denies numbness, tingling or new weaknesses Behavioral/Psych: Mood is stable, no new changes  Extremities: No lower extremity edema Breast:  denies any pain or lumps or nodules in either breasts All other systems were reviewed with the patient and are negative.  I have reviewed the past medical history, past surgical history, social history and family history with the patient and they are unchanged from previous note.  ALLERGIES:  has No Known Allergies.  MEDICATIONS:  Current Outpatient Prescriptions  Medication Sig Dispense Refill  . letrozole (FEMARA) 2.5 MG tablet Take 1 tablet (2.5 mg total) by mouth daily. 90 tablet 3  . Multiple Vitamin (MULTIVITAMIN) tablet Take 1 tablet by mouth daily.    . naproxen sodium (ANAPROX) 220 MG tablet Take 220 mg by mouth as needed (pain).     . simvastatin (ZOCOR) 20 MG tablet Take 20 mg by mouth daily after supper.     No current facility-administered medications for this visit.     PHYSICAL EXAMINATION: ECOG PERFORMANCE STATUS: 1 - Symptomatic but completely ambulatory  Vitals:   02/26/17 0826  BP: (!) 131/56  Pulse: 63  Resp: 17  Temp: 97.9 F (36.6 C)   Filed Weights   02/26/17  0826  Weight: 182 lb 12.8 oz (82.9 kg)    GENERAL:alert, no distress and comfortable SKIN: skin color, texture, turgor are normal, no rashes or significant lesions EYES: normal, Conjunctiva are pink and non-injected, sclera clear OROPHARYNX:no exudate, no erythema and lips, buccal mucosa, and tongue normal  NECK: supple, thyroid normal size, non-tender, without nodularity LYMPH:  no palpable lymphadenopathy in the cervical, axillary or  inguinal LUNGS: clear to auscultation and percussion with normal breathing effort HEART: regular rate & rhythm and no murmurs and no lower extremity edema ABDOMEN:abdomen soft, non-tender and normal bowel sounds MUSCULOSKELETAL:no cyanosis of digits and no clubbing  NEURO: alert & oriented x 3 with fluent speech, no focal motor/sensory deficits EXTREMITIES: No lower extremity edema  LABORATORY DATA:  I have reviewed the data as listed   Chemistry      Component Value Date/Time   NA 142 11/28/2016 0900   K 4.1 11/28/2016 0900   CL 106 06/23/2016 1530   CO2 27 11/28/2016 0900   BUN 16.5 11/28/2016 0900   CREATININE 0.7 11/28/2016 0900      Component Value Date/Time   CALCIUM 9.3 11/28/2016 0900   ALKPHOS 79 11/28/2016 0900   AST 19 11/28/2016 0900   ALT 21 11/28/2016 0900   BILITOT 0.97 11/28/2016 0900       Lab Results  Component Value Date   WBC 4.2 11/28/2016   HGB 13.6 11/28/2016   HCT 41.7 11/28/2016   MCV 90.5 11/28/2016   PLT 157 11/28/2016   NEUTROABS 3.0 11/28/2016    ASSESSMENT & PLAN:  Malignant neoplasm of upper-outer quadrant of right female breast ([place]) Right lumpectomy 05/26/2016: IDC with DCIS 1.6 cm, margins, 0/2 lymph nodes negative, ER 100%, PR 20%, Ki-67 15%, HER-2 negative ratio 1.0 T1 BN 0 stage IA pathologic stage Oncotype DX 26, 17% risk of recurrence  Treatment Summary: 1. Adjuvant chemotherapy with Taxotere and Cytoxan X 4 06/27/16 to 9/12/17followed by 2. Adjuvant radiation therapy 10/05/16 to 11/02/16 3. Adjuvant antiestrogen therapy with Letrozole 2.5 mg daily Started 11/28/2016 ------------------------------------------------------------------------------------------------------------------------------------------ Letrozole toxicities: 1. Hot flashes quite severe Previously advised her to change the time of the day she takes the letrozole pill. This did not seem to help. I instructed her to stop anastrozole for 2 weeks. If her symptoms  improve then we can switch her to a different antiestrogen. If her symptoms do not improve then the symptoms of menopausal symptoms and that there may not be any change by switching her antiestrogen therapy. She will call us in 2 weeks to let us know about how she feels.  Will refer patient to survivorship care plan. Return to clinic in 6 months for follow-up  I spent 25 minutes talking to the patient of which more than half was spent in counseling and coordination of care.  No orders of the defined types were placed in this encounter.  The patient has a good understanding of the overall plan. she agrees with it. she will call with any problems that may develop before the next visit here.   Rulon Eisenmenger, MD 02/26/17

## 2017-02-26 NOTE — Assessment & Plan Note (Signed)
Right lumpectomy 05/26/2016: IDC with DCIS 1.6 cm, margins, 0/2 lymph nodes negative, ER 100%, PR 20%, Ki-67 15%, HER-2 negative ratio 1.0 T1 BN 0 stage IA pathologic stage Oncotype DX 26, 17% risk of recurrence  Treatment Summary: 1. Adjuvant chemotherapy with Taxotere and Cytoxan X 4 06/27/16 to 9/12/17followed by 2. Adjuvant radiation therapy 10/05/16 to 11/02/16 3. Adjuvant antiestrogen therapy with Letrozole 2.5 mg daily Started 11/28/2016 ------------------------------------------------------------------------------------------------------------------------------------------ Letrozole toxicities: 1. Hot flashes quite severe Previously advised her to change the time of the day she takes the letrozole pill. Return to clinic in 6 months for follow-up

## 2017-03-08 ENCOUNTER — Telehealth: Payer: Self-pay | Admitting: Oncology

## 2017-03-08 NOTE — Telephone Encounter (Signed)
sw pt to confirm SCP appt in May per LOS

## 2017-03-13 ENCOUNTER — Other Ambulatory Visit: Payer: Self-pay | Admitting: Emergency Medicine

## 2017-03-13 ENCOUNTER — Telehealth: Payer: Self-pay | Admitting: Emergency Medicine

## 2017-03-13 MED ORDER — ANASTROZOLE 1 MG PO TABS: 1.0000 mg | ORAL_TABLET | Freq: Every day | ORAL | 3 refills | Status: DC

## 2017-03-13 NOTE — Telephone Encounter (Signed)
Patient called to report that after holding the Letrozole for 2 weeks she now only has hot flashes during the day and she can now sleep normally at night. Per Dr Lindi Adie; rx sent to patient's pharmacy for Anastrozole; called patient and advised her of this. Instructed patient to return as scheduled and to call this office for any concerns.   Will also call and request bone density from her PCP and have Dr Lindi Adie review the results. Patient verbalized understanding.

## 2017-03-14 ENCOUNTER — Telehealth: Payer: Self-pay | Admitting: Emergency Medicine

## 2017-03-14 NOTE — Telephone Encounter (Signed)
Spoke with patient; advised her per Dr Lindi Adie that her bone density results were normal; advised her per the report appears to have mild osteopenia. Patient verbalized understanding and will have report reviewed by MD at next office visit.

## 2017-03-21 ENCOUNTER — Ambulatory Visit (INDEPENDENT_AMBULATORY_CARE_PROVIDER_SITE_OTHER): Payer: PPO | Admitting: Podiatry

## 2017-03-21 DIAGNOSIS — M79671 Pain in right foot: Secondary | ICD-10-CM

## 2017-03-21 DIAGNOSIS — L851 Acquired keratosis [keratoderma] palmaris et plantaris: Secondary | ICD-10-CM | POA: Diagnosis not present

## 2017-03-21 DIAGNOSIS — L84 Corns and callosities: Secondary | ICD-10-CM | POA: Diagnosis not present

## 2017-03-21 DIAGNOSIS — M79672 Pain in left foot: Secondary | ICD-10-CM

## 2017-03-24 NOTE — Progress Notes (Signed)
   Subjective: Patient presents to the office today for chief complaint of painful callus lesions of the bilateral feet. She states she has two on the left heel and one on the right heel. Patient states that the pain is ongoing for several years and is affecting their ability to ambulate without pain. Patient presents today for further treatment and evaluation.  Objective:  Physical Exam General: Alert and oriented x3 in no acute distress  Dermatology: Hyperkeratotic lesion present on the bilateral sub-fifth metatarsal. Pain on palpation with a central nucleated core noted.  Skin is warm, dry and supple bilateral lower extremities. Negative for open lesions or macerations.  Vascular: Palpable pedal pulses bilaterally. No edema or erythema noted. Capillary refill within normal limits.  Neurological: Epicritic and protective threshold grossly intact bilaterally.   Musculoskeletal Exam: Pain on palpation at the keratotic lesion noted. Range of motion within normal limits bilateral. Muscle strength 5/5 in all groups bilateral.  Assessment: #1 porokeratosis bilateral sub-fifth metatarsal   Plan of Care:  #1 Patient evaluated #2 Excisional debridement of  keratoic lesion using a chisel blade was performed without incident.  #3 Treated area(s) with Salinocaine and dressed with light dressing. #4 Patient is to return to the clinic PRN.   Edrick Kins, DPM [facility]  Dr. Edrick Kins, [address]                                        [place], [place] [address]                Office [phone]  Fax [phone]

## 2017-04-06 ENCOUNTER — Ambulatory Visit
Admission: RE | Admit: 2017-04-06 | Discharge: 2017-04-06 | Disposition: A | Discharge status: Home / Self Care | Payer: PPO | Source: Ambulatory Visit | Attending: Hematology and Oncology | Admitting: Hematology and Oncology

## 2017-04-06 DIAGNOSIS — C50411 Malignant neoplasm of upper-outer quadrant of right female breast: Secondary | ICD-10-CM

## 2017-04-06 DIAGNOSIS — R928 Other abnormal and inconclusive findings on diagnostic imaging of breast: Secondary | ICD-10-CM | POA: Diagnosis not present

## 2017-04-06 DIAGNOSIS — Z170 Estrogen receptor positive status [ER+]: Principal | ICD-10-CM

## 2017-04-06 HISTORY — DX: Personal history of antineoplastic chemotherapy: Z92.21

## 2017-04-06 HISTORY — DX: Personal history of irradiation: Z92.3

## 2017-04-17 ENCOUNTER — Other Ambulatory Visit: Payer: Self-pay | Admitting: Emergency Medicine

## 2017-04-17 MED ORDER — ANASTROZOLE 1 MG PO TABS: 1.0000 mg | ORAL_TABLET | Freq: Every day | ORAL | 3 refills | Status: DC

## 2017-05-01 DIAGNOSIS — E049 Nontoxic goiter, unspecified: Secondary | ICD-10-CM | POA: Diagnosis not present

## 2017-05-01 DIAGNOSIS — E785 Hyperlipidemia, unspecified: Secondary | ICD-10-CM | POA: Diagnosis not present

## 2017-05-01 DIAGNOSIS — C50411 Malignant neoplasm of upper-outer quadrant of right female breast: Secondary | ICD-10-CM | POA: Diagnosis not present

## 2017-05-16 ENCOUNTER — Telehealth: Payer: Self-pay | Admitting: *Deleted

## 2017-05-16 ENCOUNTER — Ambulatory Visit (HOSPITAL_BASED_OUTPATIENT_CLINIC_OR_DEPARTMENT_OTHER): Payer: PPO | Admitting: Adult Health

## 2017-05-16 ENCOUNTER — Encounter: Payer: Self-pay | Admitting: Adult Health

## 2017-05-16 VITALS — BP 139/59 | HR 63 | Temp 98.5°F | Resp 18 | Ht 67.0 in | Wt 183.5 lb

## 2017-05-16 DIAGNOSIS — Z79811 Long term (current) use of aromatase inhibitors: Secondary | ICD-10-CM

## 2017-05-16 DIAGNOSIS — C50411 Malignant neoplasm of upper-outer quadrant of right female breast: Secondary | ICD-10-CM

## 2017-05-16 DIAGNOSIS — Z170 Estrogen receptor positive status [ER+]: Secondary | ICD-10-CM

## 2017-05-16 NOTE — Progress Notes (Signed)
CLINIC:  Survivorship   REASON FOR VISIT:  Routine follow-up post-treatment for a recent history of breast cancer.  BRIEF ONCOLOGIC HISTORY:    Malignant neoplasm of upper-outer quadrant of right female breast ([place])   04/05/2016 Mammogram    Asymmetry in the right breast lateral middle depth, 11 mm non-circumscribed mass ([facility] mammogram)      04/05/2016 Initial Diagnosis    Right breast biopsy: Invasive ductal carcinoma ER 90-100%, PR 20%, Ki-67 20%, HER-2 Neg, T1b N0 stage IA clinical stage      05/26/2016 Surgery    Right lumpectomy: IDC with DCIS 1.6, negative margins, 2 SLN negative, ER 100%, PR 20%, Ki-67 15%, HER-2 negative ratio 1.0 T1 BN 0 stage IA pathologic stage, Oncotype DX 26, 17% ROR      06/27/2016 - 08/29/2016 Chemotherapy    Adjuvant chemotherapy with Taxotere and Cytoxan 4      10/05/2016 - 11/02/2016 Radiation Therapy    Adjuvant XRT      11/28/2016 -  Anti-estrogen oral therapy    Letrozole 1 mg daily, changed to Anastrozole in March, 2018       INTERVAL HISTORY:  Mercedes Nelson presents to the [facility] today for our initial meeting to review her survivorship care plan detailing her treatment course for breast cancer, as well as monitoring long-term side effects of that treatment, education regarding health maintenance, screening, and overall wellness and health promotion.     Overall, Mercedes Nelson reports feeling quite well.  She was initially started on Letrozole post treatment.  She was having a hard time with hot flashes at night and sleep pattern disturbance.  The sleep issues improved after coming off the Letrozole.  She was started on Anastrozole, and though she still has some sleep issues, they aren't as significant as when she was taking the letrozole.  She is working on losing weight and tells me she has 10 more pounds to go.      REVIEW OF SYSTEMS:  Review of Systems  Constitutional: Negative for appetite change, chills, diaphoresis,  fatigue, fever and unexpected weight change.  HENT:   Negative for hearing loss and lump/mass.   Eyes: Negative for eye problems and icterus.  Respiratory: Negative for chest tightness, cough and shortness of breath.   Cardiovascular: Negative for chest pain, leg swelling and palpitations.  Gastrointestinal: Negative for abdominal distention and abdominal pain.  Endocrine: Negative for hot flashes.  Genitourinary: Negative for difficulty urinating.   Musculoskeletal: Negative for arthralgias.  Skin: Negative for itching and rash.  Neurological: Negative for dizziness, extremity weakness and numbness.  Hematological: Negative for adenopathy. Does not bruise/bleed easily.  Psychiatric/Behavioral: Negative for depression. The patient is not nervous/anxious.   Breast: Denies any new nodularity, masses, tenderness, nipple changes, or nipple discharge.     ONCOLOGY TREATMENT TEAM:  1. Surgeon:  Dr. Dalbert Batman at [facility] 2. Medical Oncologist: Dr. Lindi Adie 3. Radiation Oncologist: Dr. Lisbeth Renshaw   PAST MEDICAL/SURGICAL HISTORY:  Past Medical History:  Diagnosis Date  . Breast cancer ([place]) 04/20/16   Right breast invasive ductal ca  . Goiter   . Headache april 2017   "migraine and see starry thing" vision is affected, last 30 minutes  . History of urinary tract infection   . Personal history of chemotherapy   . Personal history of radiation therapy   . PONV (postoperative nausea and vomiting)    "worse headache of my life", headache 12 hours after procedure  . Thyroid disease  Past Surgical History:  Procedure Laterality Date  . BREAST LUMPECTOMY Right 05/26/2016  . BREAST LUMPECTOMY WITH RADIOACTIVE SEED AND SENTINEL LYMPH NODE BIOPSY Right 05/26/2016   Procedure: BREAST LUMPECTOMY WITH RADIOACTIVE SEED AND SENTINEL LYMPH NODE BIOPSY, INJECT BLUE DYE RIGHT BREAST;  Surgeon: Fanny Skates, MD;  Location: [place];  Service: General;  Laterality: Right;  . CERVICAL ABLATION  1986     cervical lesion  . PORTACATH PLACEMENT Left 06/26/2016   Procedure: INSERTION PORT-A-CATH WITH [place];  Surgeon: Fanny Skates, MD;  Location: [facility];  Service: General;  Laterality: Left;  . TUMOR REMOVAL  1985   cervix     ALLERGIES:  No Known Allergies   CURRENT MEDICATIONS:  Outpatient Encounter Prescriptions as of 05/16/2017  Medication Sig Note  . anastrozole (ARIMIDEX) 1 MG tablet Take 1 tablet (1 mg total) by mouth daily.   . Calcium Carbonate-Vitamin D (CALCIUM 500/D) 500-125 MG-UNIT TABS Take 1,200 mg by mouth 2 (two) times daily.   . Multiple Vitamin (MULTIVITAMIN) tablet Take 1 tablet by mouth daily.   . naproxen sodium (ANAPROX) 220 MG tablet Take 220 mg by mouth as needed (pain).    . simvastatin (ZOCOR) 20 MG tablet Take 20 mg by mouth daily after supper. 10/05/2016: Received from: External Pharmacy   No facility-administered encounter medications on file as of 05/16/2017.      ONCOLOGIC FAMILY HISTORY:  Family History  Problem Relation Age of Onset  . Cancer Other        breast  . Hypertension Mother   . Hypertension Father   . Cancer Father        prostate  . Hypertension Sister   . Hypertension Brother   . Cancer Maternal Grandmother        ovarian     GENETIC COUNSELING/TESTING: Not indicated at this time  SOCIAL HISTORY:  Mercedes Nelson is single lives alone in [place], [place].  She denies any current or history of tobacco, alcohol, or illicit drug use.     PHYSICAL EXAMINATION:  Vital Signs:   Vitals:   05/16/17 1118  BP: (!) 139/59  Pulse: 63  Resp: 18  Temp: 98.5 F (36.9 C)   Filed Weights   05/16/17 1118  Weight: 183 lb 8 oz (83.2 kg)   General: Well-nourished, well-appearing female in no acute distress.  She is unaccompanied today.   HEENT: Head is normocephalic.  Pupils equal and reactive to light. Conjunctivae clear without exudate.  Sclerae anicteric. Oral mucosa is pink, moist.  Oropharynx is pink without lesions  or erythema.  Lymph: No cervical, supraclavicular, or infraclavicular lymphadenopathy noted on palpation.  Cardiovascular: Regular rate and rhythm.Marland Kitchen Respiratory: Clear to auscultation bilaterally. Chest expansion symmetric; breathing non-labored.  GI: Abdomen soft and round; non-tender, non-distended. Bowel sounds normoactive.  GU: Deferred.  Neuro: No focal deficits. Steady gait.  Psych: Mood and affect normal and appropriate for situation.  Extremities: No edema. Skin: Warm and dry.  LABORATORY DATA:  None for this visit.  DIAGNOSTIC IMAGING:    ASSESSMENT AND PLAN:  Ms.. Nelson is a pleasant 67 y.o. female with Stage IA right breast invasive ductal carcinoma, ER+/PR+/HER2-, diagnosed in 03/2016, treated with lumpectomy, adjuvant chemotherapy, adjuvant radiation therapy, and anti-estrogen therapy with Anastrozole.  She presents to the Survivorship Clinic for our initial meeting and routine follow-up post-completion of treatment for breast cancer.    1. Stage IA right breast cancer:  Mercedes Nelson is continuing to recover from definitive treatment for  breast cancer. She will follow-up with her medical oncologist, Dr. Lindi Adie in September, 2018 with history and physical exam per surveillance protocol.  She will continue her anti-estrogen therapy with Anastrozole. Thus far, she is tolerating the Anastrozole well, with minimal side effects. She was instructed to make Dr. Lindi Adie or myself aware if she begins to experience any worsening side effects of the medication and I could see her back in clinic to help manage those side effects, as needed. Today, a comprehensive survivorship care plan and treatment summary was reviewed with the patient today detailing her breast cancer diagnosis, treatment course, potential late/long-term effects of treatment, appropriate follow-up care with recommendations for the future, and patient education resources.  A copy of this summary, along with a letter will be sent to  the patient's primary care provider via mail/fax/In Basket message after today's visit.    2. Bone health:  Given Mercedes Nelson's age/history of breast cancer and her current treatment regimen including anti-estrogen therapy with Anastrozle, she is at risk for bone demineralization.  Her last DEXA scan was 12/2016 and consistent with osteopenia of the right femur.  She was encouraged to increase her consumption of foods rich in calcium, as well as increase her weight-bearing activities.  She was given education on specific activities to promote bone health.  3. Cancer screening:  Due to Mercedes Nelson's history and her age, she should receive screening for skin cancers, colon cancer, and gynecologic cancers.  The information and recommendations are listed on the patient's comprehensive care plan/treatment summary and were reviewed in detail with the patient.    4. Health maintenance and wellness promotion: Mercedes Nelson was encouraged to consume 5-7 servings of fruits and vegetables per day. We reviewed the "Nutrition Rainbow" handout, as well as the handout "Take Control of Your Health and Reduce Your Cancer Risk" from the [employer].  She was also encouraged to engage in moderate to vigorous exercise for 30 minutes per day most days of the week. We discussed the [employer] fitness program, which is designed for cancer survivors to help them become more physically fit after cancer treatments.  She was instructed to limit her alcohol consumption and continue to abstain from tobacco use.     5. Support services/counseling: It is not uncommon for this period of the patient's cancer care trajectory to be one of many emotions and stressors.  We discussed an opportunity for her to participate in the next session of [facility] ("Finding Your New Normal") support group series designed for patients after they have completed treatment.   Mercedes Nelson was encouraged to take advantage of our many other support services  programs, support groups, and/or counseling in coping with her new life as a cancer survivor after completing anti-cancer treatment.  She was offered support today through active listening and expressive supportive counseling.  She was given information regarding our available services and encouraged to contact me with any questions or for help enrolling in any of our support group/programs.    Dispo:   -Return to cancer center in September, 2018 for follow up with Dr. Lindi Adie as scheduled -She is welcome to return back to the Survivorship Clinic at any time; no additional follow-up needed at this time.  -Consider referral back to survivorship as a long-term survivor for continued surveillance  A total of (30) minutes of face-to-face time was spent with this patient with greater than 50% of that time in counseling and care-coordination.   Gardenia Phlegm, NP  [place] [phone]   Note: PRIMARY CARE PROVIDER Via, Lennette Bihari, [place] [phone]

## 2017-05-16 NOTE — Telephone Encounter (Signed)
Per Mendel Ryder, NP patient agreed to meet with genetic counselor to further discuss genetic testing.  Patient prefers a morning appointment June 11 th.   A scheduling message was sent.

## 2017-05-16 NOTE — Telephone Encounter (Signed)
-----   Message from Gardenia Phlegm, NP sent at 05/16/2017 12:35 PM EDT ----- Please refer patient for genetic testing.  Will you call her and tell her the genetic counselor would like to meet with her to discuss?  Thanks, L ----- Message ----- From: Clarene Essex, Counselor Sent: 05/16/2017  12:30 PM To: Gardenia Phlegm, NP  OK.  Please refer her.  We can discuss at the appointment ----- Message ----- From: Gardenia Phlegm, NP Sent: 05/16/2017  11:41 AM To: Clarene Essex, Counselor  Patient is interested in discussing genetic testing with you and possibility of out of pocket payment if affordable?

## 2017-05-21 ENCOUNTER — Telehealth: Payer: Self-pay | Admitting: Hematology and Oncology

## 2017-05-21 NOTE — Telephone Encounter (Signed)
Confirmed genetics  appt 6/14 at 1 pm

## 2017-05-31 ENCOUNTER — Other Ambulatory Visit: Payer: PPO

## 2017-05-31 ENCOUNTER — Ambulatory Visit (HOSPITAL_BASED_OUTPATIENT_CLINIC_OR_DEPARTMENT_OTHER): Payer: PPO | Admitting: Genetics

## 2017-05-31 DIAGNOSIS — Z803 Family history of malignant neoplasm of breast: Secondary | ICD-10-CM | POA: Diagnosis not present

## 2017-05-31 DIAGNOSIS — Z8042 Family history of malignant neoplasm of prostate: Secondary | ICD-10-CM | POA: Diagnosis not present

## 2017-05-31 DIAGNOSIS — Z806 Family history of leukemia: Secondary | ICD-10-CM | POA: Diagnosis not present

## 2017-05-31 DIAGNOSIS — C50911 Malignant neoplasm of unspecified site of right female breast: Secondary | ICD-10-CM | POA: Diagnosis not present

## 2017-05-31 DIAGNOSIS — Z170 Estrogen receptor positive status [ER+]: Secondary | ICD-10-CM

## 2017-05-31 DIAGNOSIS — Z8041 Family history of malignant neoplasm of ovary: Secondary | ICD-10-CM

## 2017-05-31 DIAGNOSIS — C50411 Malignant neoplasm of upper-outer quadrant of right female breast: Secondary | ICD-10-CM | POA: Diagnosis not present

## 2017-05-31 DIAGNOSIS — Z807 Family history of other malignant neoplasms of lymphoid, hematopoietic and related tissues: Secondary | ICD-10-CM

## 2017-06-01 ENCOUNTER — Encounter: Payer: Self-pay | Admitting: Genetics

## 2017-06-01 NOTE — Progress Notes (Signed)
REFERRING PROVIDER: Gardenia Phlegm, NP [place], [place] [address]  PRIMARY PROVIDER:  Dineen Kid, MD  PRIMARY REASON FOR VISIT:  1. Malignant neoplasm of right breast in female, estrogen receptor positive, unspecified site of breast ([place])   2. Family history of ovarian cancer   3. Family history of breast cancer   4. Family history of leukemia   5. Family history of prostate cancer   6. Family history of lymphoma      HISTORY OF PRESENT ILLNESS:   Mercedes Nelson, a 66 y.o. female, was seen for a [place] cancer genetics consultation at the request of Dr. Delice Bison due to a personal and family history of cancer.  Mercedes Nelson presents to clinic today to discuss the possibility of a hereditary predisposition to cancer, genetic testing, and to further clarify her future cancer risks, as well as potential cancer risks for family members.   In April 2017, at the age of 19, Mercedes Nelson was diagnosed with ER/PR+ HER2- invasive ductal carcinoma of the right breast. This was treated with lumpectomy, chemotherapy, radiation, and anti-estrogen therapy.   CANCER HISTORY:    Malignant neoplasm of upper-outer quadrant of right female breast ([place])   04/05/2016 Mammogram    Asymmetry in the right breast lateral middle depth, 11 mm non-circumscribed mass ([facility] mammogram)      04/05/2016 Initial Diagnosis    Right breast biopsy: Invasive ductal carcinoma ER 90-100%, PR 20%, Ki-67 20%, HER-2 Neg, T1b N0 stage IA clinical stage      05/26/2016 Surgery    Right lumpectomy: IDC with DCIS 1.6, negative margins, 2 SLN negative, ER 100%, PR 20%, Ki-67 15%, HER-2 negative ratio 1.0 T1 BN 0 stage IA pathologic stage, Oncotype DX 26, 17% ROR      06/27/2016 - 08/29/2016 Chemotherapy    Adjuvant chemotherapy with Taxotere and Cytoxan 4      10/05/2016 - 11/02/2016 Radiation Therapy    Adjuvant XRT      11/28/2016 -  Anti-estrogen oral therapy    Letrozole 1 mg daily, changed to  Anastrozole in March, 2018       Past Medical History:  Diagnosis Date  . Breast cancer ([place]) 04/20/16   Right breast invasive ductal ca  . Goiter   . Headache april 2017   "migraine and see starry thing" vision is affected, last 30 minutes  . History of urinary tract infection   . Personal history of chemotherapy   . Personal history of radiation therapy   . PONV (postoperative nausea and vomiting)    "worse headache of my life", headache 12 hours after procedure  . Thyroid disease     Past Surgical History:  Procedure Laterality Date  . BREAST LUMPECTOMY Right 05/26/2016  . BREAST LUMPECTOMY WITH RADIOACTIVE SEED AND SENTINEL LYMPH NODE BIOPSY Right 05/26/2016   Procedure: BREAST LUMPECTOMY WITH RADIOACTIVE SEED AND SENTINEL LYMPH NODE BIOPSY, INJECT BLUE DYE RIGHT BREAST;  Surgeon: Fanny Skates, MD;  Location: [facility];  Service: General;  Laterality: Right;  . CERVICAL ABLATION  1986   cervical lesion  . PORTACATH PLACEMENT Left 06/26/2016   Procedure: INSERTION PORT-A-CATH WITH [place];  Surgeon: Fanny Skates, MD;  [facility];  Service: General;  Laterality: Left;  . TUMOR REMOVAL  1985   cervix    Social History   Social History  . Marital status: Single    Spouse name: N/A  . Number of children: N/A  . Years of education: N/A  Occupational History  . retired     Former Corporate treasurer   Social History Main Topics  . Smoking status: Former Smoker    Packs/day: 0.50    Years: 4.00    Types: Cigarettes    Quit date: 12/18/1974  . Smokeless tobacco: Never Used     Comment: quit 33 years ago  . Alcohol use 0.0 oz/week     Comment: glass wine or beer daily  . Drug use: No  . Sexual activity: Not on file   Other Topics Concern  . Not on file   Social History Narrative  . No narrative on file     FAMILY HISTORY:  We obtained a detailed, 4-generation family history.  Significant diagnoses are listed below: Family History  Problem Relation Age of Onset   . Breast cancer Other 90       d.90s  . Hypertension Mother   . Hypertension Father   . Prostate cancer Father 63  . Heart attack Father        d.63  . Hypertension Sister   . Hypertension Brother   . Ovarian cancer Maternal Grandmother 78       d.79  . Leukemia Maternal Aunt 85       d.86  . Leukemia Maternal Uncle 67       d.85  . Lymphoma Brother 34       d.39  . Leukemia Maternal Aunt 45       d.66  . Lung cancer Maternal Uncle 74       d.83 history of smoking   Mercedes Nelson has no children. She has two brothers and one sister. One brother died at the age of 70 with lymphoma and AIDS. The other brother is 42 without cancers. Her sister is 35 without cancers.  Mercedes Nelson mother is 42 and has no cancer history. Mercedes Nelson had two maternal uncles and two maternal aunts. Both aunts and one uncle were diagnosed with leukemia in their 25s and 25s and died within about a year of their diagnosis. The other uncle was diagnosed with lung cancer at 47 and died at 21. He had a history of smoking. Mercedes Nelson maternal grandmother was diagnosed with ovarian cancer at age 41 and died at 79. This grandmother's sister was diagnosed with breast cancer in her 78s, but died of other causes.  Mercedes Nelson father was diagnosed with prostate cancer at age 56 and died at the same age from a heart attack. Her father's only sibling is a brother who is 63 without cancers.  Mercedes Nelson is unaware of previous family history of genetic testing for hereditary cancer risks. Patient reports that her ancestry-based genetic testing indicated that she is of [place], [place], [place], [place], and [place] decent. There is no reported Ashkenazi Jewish ancestry. There is no known consanguinity.  GENETIC COUNSELING ASSESSMENT: Mercedes Nelson is a 66 y.o. female with a personal and family history of cancer which is somewhat suggestive of a hereditary cancer syndrome and predisposition to cancer. We, therefore, discussed and  recommended the following at today's visit.   DISCUSSION: We reviewed the characteristics, features and inheritance patterns of hereditary cancer syndromes. We also discussed genetic testing, including the appropriate family members to test, the process of testing, insurance coverage and turn-around-time for results. We discussed the implications of a negative, positive and/or variant of uncertain significant result. We recommended Mercedes Nelson pursue genetic testing for the Multi-Cancer Panel offered by [facility]. [facility]'s Multi-Cancer Panel  includes analysis of the following 83 genes: ALK, APC, ATM, AXIN2, BAP1, BARD1, BLM, BMPR1A, BRCA1, BRCA2, BRIP1, CASR, CDC73, CDH1, CDK4, CDKN1B, CDKN1C, CDKN2A, CEBPA, CHEK2, CTNNA1, DICER1, DIS3L2, EGFR, EPCAM, FH, FLCN, GATA2, GPC3, GREM1, HOXB13, HRAS, KIT, MAX, MEN1, MET, MITF, MLH1, MSH2, MSH3, MSH6, MUTYH, NBN, NF1, NF2, NTHL1, PALB2, PDGFRA, PHOX2B, PMS2, POLD1, POLE, POT1, PRKAR1A, PTCH1, PTEN, RAD50, RAD51C, RAD51D, RB1, RECQL4, RET, RUNX1, SDHA, SDHAF2, SDHB, SDHC, SDHD, SMAD4, SMARCA4, SMARCB1, SMARCE1, STK11, SUFU, TERC, TERT, TMEM127, TP53, TSC1, TSC2, VHL, WRN, WT1.  Based on Mercedes Nelson's personal and family history of cancer, she meets medical criteria for genetic testing. Despite that she meets criteria, she may still have an out of pocket cost. We discussed that if her out of pocket cost for testing is over $100, the laboratory will call and confirm whether she wants to proceed with testing.  If the out of pocket cost of testing is less than $100 she will be billed by the genetic testing laboratory.    PLAN: After considering the risks, benefits, and limitations, Mercedes Nelson  provided informed consent to pursue genetic testing and the blood sample was sent to [facility] for analysis of the 83-gene Multi-Cancers Panel. Results should be available within approximately 3 weeks' time, at which point they will be disclosed by telephone to Mercedes Nelson, as will  any additional recommendations warranted by these results. This information will also be available in Epic. We encouraged Mercedes Nelson to remain in contact with cancer genetics annually so that we can continuously update the family history and inform her of any changes in cancer genetics and testing that may be of benefit for her family. Mercedes Nelson questions were answered to her satisfaction today. Our contact information was provided should additional questions or concerns arise.  Lastly, we encouraged Mercedes Nelson to remain in contact with cancer genetics annually so that we can continuously update the family history and inform her of any changes in cancer genetics and testing that may be of benefit for this family.   Ms.  Nelson questions were answered to her satisfaction today. Our contact information was provided should additional questions or concerns arise. Thank you for the referral and allowing [place] to share in the care of your patient.   Mal Misty, MS, [facility] Certified Naval [email or web address] phone: [phone]  The patient was seen for a total of 40 minutes in face-to-face genetic counseling.  This patient was discussed with Drs. Magrinat, Lindi Adie and/or Burr Medico who agrees with the above.    _______________________________________________________________________ For Office Staff:  Number of people involved in session: 1 Was an Intern/ student involved with case: no

## 2017-06-11 ENCOUNTER — Encounter: Payer: Self-pay | Admitting: Genetics

## 2017-06-11 ENCOUNTER — Ambulatory Visit: Payer: Self-pay | Admitting: Genetics

## 2017-06-11 ENCOUNTER — Telehealth: Payer: Self-pay | Admitting: Genetics

## 2017-06-11 DIAGNOSIS — Z1379 Encounter for other screening for genetic and chromosomal anomalies: Secondary | ICD-10-CM

## 2017-06-11 HISTORY — DX: Encounter for other screening for genetic and chromosomal anomalies: Z13.79

## 2017-06-11 NOTE — Telephone Encounter (Signed)
Reviewed that germline genetic testing revealed no pathogenic mutations. This is considered to be a negative result. Testing was performed through [facility]'s 83-gene Multi-Cancers Panel. [facility]'s Multi-Cancer Panel includes analysis of the following 83 genes: ALK, APC, ATM, AXIN2, BAP1, BARD1, BLM, BMPR1A, BRCA1, BRCA2, BRIP1, CASR, CDC73, CDH1, CDK4, CDKN1B, CDKN1C, CDKN2A, CEBPA, CHEK2, CTNNA1, DICER1, DIS3L2, EGFR, EPCAM, FH, FLCN, GATA2, GPC3, GREM1, HOXB13, HRAS, KIT, MAX, MEN1, MET, MITF, MLH1, MSH2, MSH3, MSH6, MUTYH, NBN, NF1, NF2, NTHL1, PALB2, PDGFRA, PHOX2B, PMS2, POLD1, POLE, POT1, PRKAR1A, PTCH1, PTEN, RAD50, RAD51C, RAD51D, RB1, RECQL4, RET, RUNX1, SDHA, SDHAF2, SDHB, SDHC, SDHD, SMAD4, SMARCA4, SMARCB1, SMARCE1, STK11, SUFU, TERC, TERT, TMEM127, TP53, TSC1, TSC2, VHL, WRN, WT1.   For more detailed discussion, please see genetic counseling documentation from 06/11/2017. Result report dated 06/09/2017.

## 2017-06-11 NOTE — Progress Notes (Signed)
HPI: Ms. Seifert was previously seen in the [facility] on 05/31/2017 due to a personal and family history of cancer and concerns regarding a hereditary predisposition to cancer. Please refer to our prior cancer genetics clinic note for more information regarding Ms. Wahba's medical, social and family histories, and our assessment and recommendations, at the time. Ms. Brashear recent genetic test results were disclosed to her, as were recommendations warranted by these results. These results and recommendations are discussed in more detail below.  CANCER HISTORY: In April 2017, at the age of 66, Ms. 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 This was treated with lumpectomy, chemotherapy, radiation, and anti-estrogen therapy.     Malignant neoplasm of upper-outer quadrant of right female breast ([place])   04/05/2016 Mammogram    Asymmetry in the right breast lateral middle depth, 11 mm non-circumscribed mass ([facility] mammogram)      04/05/2016 Initial Diagnosis    Right breast biopsy: Invasive ductal carcinoma ER 90-100%, PR 20%, Ki-67 20%, HER-2 Neg, T1b N0 stage IA clinical stage      05/26/2016 Surgery    Right lumpectomy: IDC with DCIS 1.6, negative margins, 2 SLN negative, ER 100%, PR 20%, Ki-67 15%, HER-2 negative ratio 1.0 T1 BN 0 stage IA pathologic stage, Oncotype DX 26, 17% ROR      06/27/2016 - 08/29/2016 Chemotherapy    Adjuvant chemotherapy with Taxotere and Cytoxan 4      10/05/2016 - 11/02/2016 Radiation Therapy    Adjuvant XRT      11/28/2016 -  Anti-estrogen oral therapy    Letrozole 1 mg daily, changed to Anastrozole in March, 2018      06/09/2017 Genetic Testing    Genetic counseling and testing for hereditary cancer syndromes performed on 05/31/2017. Results are negative for pathogenic mutations in 83 genes analyzed by [facility]'s Multi-Cancers Panel. Results are dated 06/09/2017. Genes tested: ALK, APC, ATM, AXIN2,  BAP1, BARD1, BLM, BMPR1A, BRCA1, BRCA2, BRIP1, CASR, CDC73, CDH1, CDK4, CDKN1B, CDKN1C, CDKN2A, CEBPA, CHEK2, CTNNA1, DICER1, DIS3L2, EGFR, EPCAM, FH, FLCN, GATA2, GPC3, GREM1, HOXB13, HRAS, KIT, MAX, MEN1, MET, MITF, MLH1, MSH2, MSH3, MSH6, MUTYH, NBN, NF1, NF2, NTHL1, PALB2, PDGFRA, PHOX2B, PMS2, POLD1, POLE, POT1, PRKAR1A, PTCH1, PTEN, RAD50, RAD51C, RAD51D, RB1, RECQL4, RET, RUNX1, SDHA, SDHAF2, SDHB, SDHC, SDHD, SMAD4, SMARCA4, SMARCB1, SMARCE1, STK11, SUFU, TERC, TERT, TMEM127, TP53, TSC1, TSC2, VHL, WRN, WT1.          FAMILY HISTORY:  We obtained a detailed, 4-generation family history.  Significant diagnoses are listed below: Family History  Problem Relation Age of Onset  . Breast cancer Other 90       d.90s  . Hypertension Mother   . Hypertension Father   . Prostate cancer Father 67  . Heart attack Father        d.63  . Hypertension Sister   . Hypertension Brother   . Ovarian cancer Maternal Grandmother 78       d.79  . Leukemia Maternal Aunt 85       d.86  . Leukemia Maternal Uncle 44       d.85  . Lymphoma Brother 49       d.39  . Leukemia Maternal Aunt 67       d.66  . Lung cancer Maternal Uncle 6       d.83 history of smoking   Ms. Nader has no children. She has two brothers and one sister. One brother died at  the age of 29 with lymphoma and AIDS. The other brother is 64 without cancers. Her sister is 46 without cancers.  Ms. Smouse mother is 18 and has no cancer history. Ms. Goodlow had two maternal uncles and two maternal aunts. Both aunts and one uncle were diagnosed with leukemia in their 62s and 25s and died within about a year of their diagnosis. The other uncle was diagnosed with lung cancer at 72 and died at 14. He had a history of smoking. Ms. Keathley maternal grandmother was diagnosed with ovarian cancer at age 30 and died at 37. This grandmother's sister was diagnosed with breast cancer in her 39s, but died of other causes.  Ms. Placide father was diagnosed  with prostate cancer at age 25 and died at the same age from a heart attack. Her father's only sibling is a brother who is 62 without cancers.  Ms. Tolles is unaware of previous family history of genetic testing for hereditary cancer risks. Patient reports that her ancestry-based genetic testing indicated that she is of [place], [place], [place], [place], and [place] decent. There is no reported Ashkenazi Jewish ancestry. There is no known consanguinity.  GENETIC TEST RESULTS: Genetic testing performed through [facility]'s Multi-Cancer Panel reported out on 06/09/2017 showed no pathogenic mutations. [facility] Multi-Cancer Panel includes analysis of the following 83 genes: ALK, APC, ATM, AXIN2, BAP1, BARD1, BLM, BMPR1A, BRCA1, BRCA2, BRIP1, CASR, CDC73, CDH1, CDK4, CDKN1B, CDKN1C, CDKN2A, CEBPA, CHEK2, CTNNA1, DICER1, DIS3L2, EGFR, EPCAM, FH, FLCN, GATA2, GPC3, GREM1, HOXB13, HRAS, KIT, MAX, MEN1, MET, MITF, MLH1, MSH2, MSH3, MSH6, MUTYH, NBN, NF1, NF2, NTHL1, PALB2, PDGFRA, PHOX2B, PMS2, POLD1, POLE, POT1, PRKAR1A, PTCH1, PTEN, RAD50, RAD51C, RAD51D, RB1, RECQL4, RET, RUNX1, SDHA, SDHAF2, SDHB, SDHC, SDHD, SMAD4, SMARCA4, SMARCB1, SMARCE1, STK11, SUFU, TERC, TERT, TMEM127, TP53, TSC1, TSC2, VHL, WRN, WT1.  The test report will be scanned into EPIC and will be located under the Molecular Pathology section of the Results Review tab.A portion of the result report is included below for reference.    We discussed with Ms. Basham that since the current genetic testing is not perfect, it is possible there may be a gene mutation in one of these genes that current testing cannot detect, but that chance is small. We also discussed, that it is possible that another gene that has not yet been discovered, or that we have not yet tested, is responsible for the cancer diagnoses in the family. Therefore, important to remain in touch with cancer genetics in the future so that we can continue to offer Ms. Arroyo the most up to date  genetic testing.    CANCER SCREENING RECOMMENDATIONS: This result indicates that it is unlikely Ms. Timm has an increased risk for a future cancer due to a mutation in one of these genes. Because no causative or actionable mutations were identified, it is recommended she continue to follow the cancer management and screening guidelines provided by her oncology and primary healthcare providers.   RECOMMENDATIONS FOR FAMILY MEMBERS: Even though no causative or actionable mutations were found through Ms. Avants's genetic testing, family members may be at increased risk for cancer based on family history alone. Ms. Bogen family members should discuss their family history of cancers with their physicians to establish a personalized cancer screening plan.  Based on her family history, it appears that Ms. Allbaugh's mother, siblings, and maternal cousins are candidates for genetic testing due to her maternal grandmother's history of ovarian cancer. These family members are encouraged to consult their  physicians regarding their genetic counseling and testing options.  FOLLOW-UP: Lastly, we discussed with Ms. Urquidi that cancer genetics is a rapidly advancing field and it is possible that new genetic tests will be appropriate for her and/or her family members in the future. We encouraged her to remain in contact with cancer genetics on an annual basis so we can update her personal and family histories and let her know of advances in cancer genetics that may benefit this family.   Our contact number was provided. Ms. Elderkin questions were answered to her satisfaction, and she knows she is welcome to call us at anytime with additional questions or concerns.   Mal Misty, MS, [facility] Certified Naval [email or web address]

## 2017-07-22 ENCOUNTER — Telehealth: Payer: Self-pay

## 2017-07-22 NOTE — Telephone Encounter (Signed)
Spoke with patient and she is aware of her new appt due to cme day  Mercedes Nelson

## 2017-07-30 DIAGNOSIS — H40013 Open angle with borderline findings, low risk, bilateral: Secondary | ICD-10-CM | POA: Diagnosis not present

## 2017-08-27 ENCOUNTER — Other Ambulatory Visit: Payer: Self-pay

## 2017-08-27 DIAGNOSIS — Z170 Estrogen receptor positive status [ER+]: Principal | ICD-10-CM

## 2017-08-27 DIAGNOSIS — C50411 Malignant neoplasm of upper-outer quadrant of right female breast: Secondary | ICD-10-CM

## 2017-08-27 NOTE — Assessment & Plan Note (Signed)
Right lumpectomy 05/26/2016: IDC with DCIS 1.6 cm, margins, 0/2 lymph nodes negative, ER 100%, PR 20%, Ki-67 15%, HER-2 negative ratio 1.0 T1 BN 0 stage IA pathologic stage Oncotype DX 26, 17% risk of recurrence  Treatment Summary: 1. Adjuvant chemotherapy with Taxotere and Cytoxan X 4 06/27/16 to 9/12/17followed by 2. Adjuvant radiation therapy 10/05/16 to 11/02/16 3. Adjuvant antiestrogen therapy with Letrozole 2.5 mg daily Started 11/28/2016 switched to anastrozole 02/26/17 (hot flashes) ------------------------------------------------------------------------------------------------------------------------------------------ Anastrozole toxicities:  Surveillance: 1. Breast Exam: 08/27/17: Benign 2. Mammogram 04/06/17: No evidence of malignancy  RTC in 1 year

## 2017-08-28 ENCOUNTER — Ambulatory Visit (HOSPITAL_BASED_OUTPATIENT_CLINIC_OR_DEPARTMENT_OTHER): Payer: PPO | Admitting: Hematology and Oncology

## 2017-08-28 ENCOUNTER — Other Ambulatory Visit (HOSPITAL_BASED_OUTPATIENT_CLINIC_OR_DEPARTMENT_OTHER): Payer: PPO

## 2017-08-28 DIAGNOSIS — Z170 Estrogen receptor positive status [ER+]: Principal | ICD-10-CM

## 2017-08-28 DIAGNOSIS — C50411 Malignant neoplasm of upper-outer quadrant of right female breast: Secondary | ICD-10-CM | POA: Diagnosis not present

## 2017-08-28 DIAGNOSIS — N951 Menopausal and female climacteric states: Secondary | ICD-10-CM | POA: Diagnosis not present

## 2017-08-28 DIAGNOSIS — Z79811 Long term (current) use of aromatase inhibitors: Secondary | ICD-10-CM

## 2017-08-28 LAB — CBC WITH DIFFERENTIAL/PLATELET
BASO%: 0.4 % (ref 0.0–2.0)
BASOS ABS: 0 10*3/uL (ref 0.0–0.1)
EOS ABS: 0.1 10*3/uL (ref 0.0–0.5)
EOS%: 1.6 % (ref 0.0–7.0)
HEMATOCRIT: 42.8 % (ref 34.8–46.6)
HGB: 13.8 g/dL (ref 11.6–15.9)
LYMPH#: 1.3 10*3/uL (ref 0.9–3.3)
LYMPH%: 25 % (ref 14.0–49.7)
MCH: 29.9 pg (ref 25.1–34.0)
MCHC: 32.2 g/dL (ref 31.5–36.0)
MCV: 92.8 fL (ref 79.5–101.0)
MONO#: 0.3 10*3/uL (ref 0.1–0.9)
MONO%: 5.9 % (ref 0.0–14.0)
NEUT#: 3.4 10*3/uL (ref 1.5–6.5)
NEUT%: 67.1 % (ref 38.4–76.8)
Platelets: 176 10*3/uL (ref 145–400)
RBC: 4.61 10*6/uL (ref 3.70–5.45)
RDW: 12.7 % (ref 11.2–14.5)
WBC: 5.1 10*3/uL (ref 3.9–10.3)

## 2017-08-28 LAB — COMPREHENSIVE METABOLIC PANEL
ALBUMIN: 4 g/dL (ref 3.5–5.0)
ALK PHOS: 86 U/L (ref 40–150)
ALT: 20 U/L (ref 0–55)
AST: 18 U/L (ref 5–34)
Anion Gap: 10 mEq/L (ref 3–11)
BUN: 17.7 mg/dL (ref 7.0–26.0)
CALCIUM: 9.9 mg/dL (ref 8.4–10.4)
CO2: 28 mEq/L (ref 22–29)
Chloride: 104 mEq/L (ref 98–109)
Creatinine: 0.8 mg/dL (ref 0.6–1.1)
EGFR: 79 mL/min/{1.73_m2} — AB (ref >90)
Glucose: 90 mg/dl (ref 70–140)
POTASSIUM: 4.2 meq/L (ref 3.5–5.1)
Sodium: 142 mEq/L (ref 136–145)
Total Bilirubin: 1.04 mg/dL (ref 0.20–1.20)
Total Protein: 6.5 g/dL (ref 6.4–8.3)

## 2017-08-28 MED ORDER — ANASTROZOLE 1 MG PO TABS: 1.0000 mg | ORAL_TABLET | Freq: Every day | ORAL | 3 refills | Status: DC

## 2017-08-28 NOTE — Progress Notes (Signed)
Patient Care Team: ViaLennette Bihari, MD as PCP - General (Family Medicine) Nicholas Lose, MD as Consulting Physician (Hematology and Oncology) Kyung Rudd, MD as Consulting Physician (Radiation Oncology) Delice Bison Charlestine Massed, NP as Nurse Practitioner (Hematology and Oncology)  DIAGNOSIS:  Encounter Diagnosis  Name Primary?  . Malignant neoplasm of upper-outer quadrant of right breast in female, estrogen receptor positive ([place])     SUMMARY OF ONCOLOGIC HISTORY:   Malignant neoplasm of upper-outer quadrant of right female breast ([place])   04/05/2016 Mammogram    Asymmetry in the right breast lateral middle depth, 11 mm non-circumscribed mass ([facility] mammogram)      04/05/2016 Initial Diagnosis    Right breast biopsy: Invasive ductal carcinoma ER 90-100%, PR 20%, Ki-67 20%, HER-2 Neg, T1b N0 stage IA clinical stage      05/26/2016 Surgery    Right lumpectomy: IDC with DCIS 1.6, negative margins, 2 SLN negative, ER 100%, PR 20%, Ki-67 15%, HER-2 negative ratio 1.0 T1 BN 0 stage IA pathologic stage, Oncotype DX 26, 17% ROR      06/27/2016 - 08/29/2016 Chemotherapy    Adjuvant chemotherapy with Taxotere and Cytoxan 4      10/05/2016 - 11/02/2016 Radiation Therapy    Adjuvant XRT      11/28/2016 -  Anti-estrogen oral therapy    Letrozole 1 mg daily, changed to Anastrozole in March, 2018      06/09/2017 Genetic Testing    Genetic counseling and testing for hereditary cancer syndromes performed on 05/31/2017. Results are negative for pathogenic mutations in 83 genes analyzed by [facility]'s Multi-Cancers Panel. Results are dated 06/09/2017. Genes tested: ALK, APC, ATM, AXIN2, BAP1, BARD1, BLM, BMPR1A, BRCA1, BRCA2, BRIP1, CASR, CDC73, CDH1, CDK4, CDKN1B, CDKN1C, CDKN2A, CEBPA, CHEK2, CTNNA1, DICER1, DIS3L2, EGFR, EPCAM, FH, FLCN, GATA2, GPC3, GREM1, HOXB13, HRAS, KIT, MAX, MEN1, MET, MITF, MLH1, MSH2, MSH3, MSH6, MUTYH, NBN, NF1, NF2, NTHL1, PALB2, PDGFRA, PHOX2B, PMS2, POLD1, POLE, POT1,  PRKAR1A, PTCH1, PTEN, RAD50, RAD51C, RAD51D, RB1, RECQL4, RET, RUNX1, SDHA, SDHAF2, SDHB, SDHC, SDHD, SMAD4, SMARCA4, SMARCB1, SMARCE1, STK11, SUFU, TERC, TERT, TMEM127, TP53, TSC1, TSC2, VHL, WRN, WT1.         CHIEF COMPLIANT: Follow-up on anastrozole therapy  INTERVAL HISTORY: Mercedes Nelson is a 66 year old with above-mentioned history of right breast cancer currently on anastrozole therapy. She is tolerating anastrozole much better than the letrozole. She does not have hot flashes at night. However her sleep has not been great. She has difficulty falling asleep. This happened 6 weeks 1-2 times a week. She also know allergies associated with redness of the eyes. She is beyond steroid eyedrops periodically. She also takes Claritin.  REVIEW OF SYSTEMS:   Constitutional: Denies fevers, chills or abnormal weight loss Eyes: Redness of the eyes Ears, nose, mouth, throat, and face: Denies mucositis or sore throat Respiratory: Denies cough, dyspnea or wheezes Cardiovascular: Denies palpitation, chest discomfort Gastrointestinal:  Denies nausea, heartburn or change in bowel habits Skin: Denies abnormal skin rashes Lymphatics: Denies new lymphadenopathy or easy bruising Neurological:Denies numbness, tingling or new weaknesses Behavioral/Psych: Mood is stable, no new changes  Extremities: No lower extremity edema Breast:  denies any pain or lumps or nodules in either breasts All other systems were reviewed with the patient and are negative.  I have reviewed the past medical history, past surgical history, social history and family history with the patient and they are unchanged from previous note.  ALLERGIES:  has No Known Allergies.  MEDICATIONS:  Current Outpatient Prescriptions  Medication  Sig Dispense Refill  . anastrozole (ARIMIDEX) 1 MG tablet Take 1 tablet (1 mg total) by mouth daily. 90 tablet 3  . Calcium Carbonate-Vitamin D (CALCIUM 500/D) 500-125 MG-UNIT TABS Take 1,200 mg by  mouth 2 (two) times daily.    . Multiple Vitamin (MULTIVITAMIN) tablet Take 1 tablet by mouth daily.    . naproxen sodium (ANAPROX) 220 MG tablet Take 220 mg by mouth as needed (pain).     . simvastatin (ZOCOR) 20 MG tablet Take 20 mg by mouth daily after supper.     No current facility-administered medications for this visit.     PHYSICAL EXAMINATION: ECOG PERFORMANCE STATUS: 1 - Symptomatic but completely ambulatory  Vitals:   08/28/17 0927  BP: 135/71  Pulse: 60  Resp: 18  Temp: 98.7 F (37.1 C)  SpO2: 100%   Filed Weights   08/28/17 0927  Weight: 189 lb 12.8 oz (86.1 kg)    GENERAL:alert, no distress and comfortable SKIN: skin color, texture, turgor are normal, no rashes or significant lesions EYES: normal, Conjunctiva are pink and non-injected, sclera clear OROPHARYNX:no exudate, no erythema and lips, buccal mucosa, and tongue normal  NECK: supple, thyroid normal size, non-tender, without nodularity LYMPH:  no palpable lymphadenopathy in the cervical, axillary or inguinal LUNGS: clear to auscultation and percussion with normal breathing effort HEART: regular rate & rhythm and no murmurs and no lower extremity edema ABDOMEN:abdomen soft, non-tender and normal bowel sounds MUSCULOSKELETAL:no cyanosis of digits and no clubbing  NEURO: alert & oriented x 3 with fluent speech, no focal motor/sensory deficits EXTREMITIES: No lower extremity edema BREAST: No palpable masses or nodules in either right or left breasts. No palpable axillary supraclavicular or infraclavicular adenopathy no breast tenderness or nipple discharge. (exam performed in the presence of a chaperone)  LABORATORY DATA:  I have reviewed the data as listed   Chemistry      Component Value Date/Time   NA 144 02/26/2017 0816   K 4.5 02/26/2017 0816   CL 106 06/23/2016 1530   CO2 29 02/26/2017 0816   BUN 18.3 02/26/2017 0816   CREATININE 0.8 02/26/2017 0816      Component Value Date/Time   CALCIUM 9.7  02/26/2017 0816   ALKPHOS 79 02/26/2017 0816   AST 19 02/26/2017 0816   ALT 21 02/26/2017 0816   BILITOT 1.18 02/26/2017 0816       Lab Results  Component Value Date   WBC 5.1 08/28/2017   HGB 13.8 08/28/2017   HCT 42.8 08/28/2017   MCV 92.8 08/28/2017   PLT 176 08/28/2017   NEUTROABS 3.4 08/28/2017    ASSESSMENT & PLAN:  Malignant neoplasm of upper-outer quadrant of right female breast ([place]) Right lumpectomy 05/26/2016: IDC with DCIS 1.6 cm, margins, 0/2 lymph nodes negative, ER 100%, PR 20%, Ki-67 15%, HER-2 negative ratio 1.0 T1 BN 0 stage IA pathologic stage Oncotype DX 26, 17% risk of recurrence  Treatment Summary: 1. Adjuvant chemotherapy with Taxotere and Cytoxan X 4 06/27/16 to 9/12/17followed by 2. Adjuvant radiation therapy 10/05/16 to 11/02/16 3. Adjuvant antiestrogen therapy with Letrozole 2.5 mg daily Started 11/28/2016 switched to anastrozole 02/26/17 (hot flashes) ------------------------------------------------------------------------------------------------------------------------------------------ Anastrozole toxicities: 1. Occasional muscle aches 2.  hot flashes  Surveillance: 1. Breast Exam: 08/27/17: Benign 2. Mammogram 04/06/17: No evidence of malignancy  RTC in 1 year   I spent 25 minutes talking to the patient of which more than half was spent in counseling and coordination of care.  No orders of the defined types  were placed in this encounter.  The patient has a good understanding of the overall plan. she agrees with it. she will call with any problems that may develop before the next visit here.   Rulon Eisenmenger, MD 08/28/17

## 2017-08-29 ENCOUNTER — Other Ambulatory Visit: Payer: PPO

## 2017-08-29 ENCOUNTER — Ambulatory Visit: Payer: PPO | Admitting: Hematology and Oncology

## 2017-10-04 DIAGNOSIS — Z23 Encounter for immunization: Secondary | ICD-10-CM | POA: Diagnosis not present

## 2017-11-30 DIAGNOSIS — H40013 Open angle with borderline findings, low risk, bilateral: Secondary | ICD-10-CM | POA: Diagnosis not present

## 2017-12-19 ENCOUNTER — Other Ambulatory Visit: Payer: Self-pay | Admitting: Hematology and Oncology

## 2017-12-19 DIAGNOSIS — Z853 Personal history of malignant neoplasm of breast: Secondary | ICD-10-CM

## 2018-02-08 DIAGNOSIS — Z136 Encounter for screening for cardiovascular disorders: Secondary | ICD-10-CM | POA: Diagnosis not present

## 2018-02-08 DIAGNOSIS — Z0000 Encounter for general adult medical examination without abnormal findings: Secondary | ICD-10-CM | POA: Diagnosis not present

## 2018-02-08 DIAGNOSIS — E049 Nontoxic goiter, unspecified: Secondary | ICD-10-CM | POA: Diagnosis not present

## 2018-02-08 DIAGNOSIS — Z853 Personal history of malignant neoplasm of breast: Secondary | ICD-10-CM | POA: Diagnosis not present

## 2018-02-08 DIAGNOSIS — E785 Hyperlipidemia, unspecified: Secondary | ICD-10-CM | POA: Diagnosis not present

## 2018-02-08 DIAGNOSIS — M85859 Other specified disorders of bone density and structure, unspecified thigh: Secondary | ICD-10-CM | POA: Diagnosis not present

## 2018-04-01 DIAGNOSIS — Z83511 Family history of glaucoma: Secondary | ICD-10-CM | POA: Diagnosis not present

## 2018-04-01 DIAGNOSIS — H25013 Cortical age-related cataract, bilateral: Secondary | ICD-10-CM | POA: Diagnosis not present

## 2018-04-01 DIAGNOSIS — H40013 Open angle with borderline findings, low risk, bilateral: Secondary | ICD-10-CM | POA: Diagnosis not present

## 2018-04-09 ENCOUNTER — Ambulatory Visit
Admission: RE | Admit: 2018-04-09 | Discharge: 2018-04-09 | Disposition: A | Discharge status: Home / Self Care | Payer: PPO | Source: Ambulatory Visit | Attending: Hematology and Oncology | Admitting: Hematology and Oncology

## 2018-04-09 DIAGNOSIS — R922 Inconclusive mammogram: Secondary | ICD-10-CM | POA: Diagnosis not present

## 2018-04-09 DIAGNOSIS — Z853 Personal history of malignant neoplasm of breast: Secondary | ICD-10-CM

## 2018-04-12 DIAGNOSIS — L821 Other seborrheic keratosis: Secondary | ICD-10-CM | POA: Diagnosis not present

## 2018-04-12 DIAGNOSIS — D1801 Hemangioma of skin and subcutaneous tissue: Secondary | ICD-10-CM | POA: Diagnosis not present

## 2018-04-12 DIAGNOSIS — D2262 Melanocytic nevi of left upper limb, including shoulder: Secondary | ICD-10-CM | POA: Diagnosis not present

## 2018-04-12 DIAGNOSIS — H61022 Chronic perichondritis of left external ear: Secondary | ICD-10-CM | POA: Diagnosis not present

## 2018-04-12 DIAGNOSIS — L814 Other melanin hyperpigmentation: Secondary | ICD-10-CM | POA: Diagnosis not present

## 2018-04-12 DIAGNOSIS — D692 Other nonthrombocytopenic purpura: Secondary | ICD-10-CM | POA: Diagnosis not present

## 2018-04-12 DIAGNOSIS — L820 Inflamed seborrheic keratosis: Secondary | ICD-10-CM | POA: Diagnosis not present

## 2018-04-12 DIAGNOSIS — D225 Melanocytic nevi of trunk: Secondary | ICD-10-CM | POA: Diagnosis not present

## 2018-04-22 ENCOUNTER — Ambulatory Visit: Payer: PPO | Admitting: Podiatry

## 2018-04-22 DIAGNOSIS — L851 Acquired keratosis [keratoderma] palmaris et plantaris: Secondary | ICD-10-CM | POA: Diagnosis not present

## 2018-04-24 NOTE — Progress Notes (Signed)
   Subjective: Patient presents to the office today for follow up evaluation of painful callus lesions of the bilateral feet. She states that the pain has been ongoing for several years and is affecting her ability to ambulate without pain. Patient presents today for further treatment and evaluation.   Past Medical History:  Diagnosis Date  . Breast cancer ([place]) 04/20/16   Right breast invasive ductal ca  . Genetic testing 06/11/2017   Ms. Temkin underwent genetic counseling and testing for hereditary cancer syndromes on 05/31/2017. Her results were negative for mutations in all 83 genes analyzed by [facility]'s 83-gene Multi-Cancers Panel. Genes analyzed: ALK, APC, ATM, AXIN2, BAP1, BARD1, BLM, BMPR1A, BRCA1, BRCA2, BRIP1, CASR, CDC73, CDH1, CDK4, CDKN1B, CDKN1C, CDKN2A, CEBPA, CHEK2, CTNNA1, DICER1, DIS3L2, EGFR, EPCAM, FH, FLCN, G  . Goiter   . Headache april 2017   "migraine and see starry thing" vision is affected, last 30 minutes  . History of urinary tract infection   . Personal history of chemotherapy   . Personal history of radiation therapy   . PONV (postoperative nausea and vomiting)    "worse headache of my life", headache 12 hours after procedure  . Thyroid disease      Objective:  Physical Exam General: Alert and oriented x3 in no acute distress  Dermatology: Hyperkeratotic lesion present on the bilateral sub-fifth metatarsal. Pain on palpation with a central nucleated core noted.  Skin is warm, dry and supple bilateral lower extremities. Negative for open lesions or macerations.  Vascular: Palpable pedal pulses bilaterally. No edema or erythema noted. Capillary refill within normal limits.  Neurological: Epicritic and protective threshold grossly intact bilaterally.   Musculoskeletal Exam: Pain on palpation at the keratotic lesion noted. Range of motion within normal limits bilateral. Muscle strength 5/5 in all groups bilateral.  Assessment: #1 porokeratosis bilateral  sub-fifth metatarsal   Plan of Care:  #1 Patient evaluated #2 Excisional debridement of  keratoic lesion using a chisel blade was performed without incident.  #3 Treated area(s) with Salinocaine and dressed with light dressing. #4 Patient is to return to the clinic PRN.   Edrick Kins, DPM [facility]  Dr. Edrick Kins, [address]                                        [place], [place] [address]                Office [phone]  Fax [phone]

## 2018-07-02 DIAGNOSIS — H40013 Open angle with borderline findings, low risk, bilateral: Secondary | ICD-10-CM | POA: Diagnosis not present

## 2018-08-26 ENCOUNTER — Other Ambulatory Visit: Payer: Self-pay

## 2018-08-26 DIAGNOSIS — Z170 Estrogen receptor positive status [ER+]: Principal | ICD-10-CM

## 2018-08-26 DIAGNOSIS — C50411 Malignant neoplasm of upper-outer quadrant of right female breast: Secondary | ICD-10-CM

## 2018-08-27 ENCOUNTER — Inpatient Hospital Stay: Payer: PPO

## 2018-08-27 ENCOUNTER — Inpatient Hospital Stay: Payer: PPO | Attending: Hematology and Oncology | Admitting: Hematology and Oncology

## 2018-08-27 ENCOUNTER — Telehealth: Payer: Self-pay | Admitting: Hematology and Oncology

## 2018-08-27 DIAGNOSIS — C50411 Malignant neoplasm of upper-outer quadrant of right female breast: Secondary | ICD-10-CM | POA: Insufficient documentation

## 2018-08-27 DIAGNOSIS — Z79811 Long term (current) use of aromatase inhibitors: Secondary | ICD-10-CM | POA: Insufficient documentation

## 2018-08-27 DIAGNOSIS — N951 Menopausal and female climacteric states: Secondary | ICD-10-CM | POA: Diagnosis not present

## 2018-08-27 DIAGNOSIS — Z170 Estrogen receptor positive status [ER+]: Secondary | ICD-10-CM | POA: Diagnosis not present

## 2018-08-27 LAB — CMP (CANCER CENTER ONLY)
ALBUMIN: 3.8 g/dL (ref 3.5–5.0)
ALT: 20 U/L (ref 0–44)
AST: 19 U/L (ref 15–41)
Alkaline Phosphatase: 92 U/L (ref 38–126)
Anion gap: 7 (ref 5–15)
BUN: 15 mg/dL (ref 8–23)
CHLORIDE: 106 mmol/L (ref 98–111)
CO2: 29 mmol/L (ref 22–32)
Calcium: 9.9 mg/dL (ref 8.9–10.3)
Creatinine: 0.82 mg/dL (ref 0.44–1.00)
GFR, Estimated: >60 mL/min (ref >60)
GLUCOSE: 80 mg/dL (ref 70–99)
Potassium: 4.7 mmol/L (ref 3.5–5.1)
SODIUM: 142 mmol/L (ref 135–145)
Total Bilirubin: 0.9 mg/dL (ref 0.3–1.2)
Total Protein: 6.5 g/dL (ref 6.5–8.1)

## 2018-08-27 LAB — CBC WITH DIFFERENTIAL (CANCER CENTER ONLY)
Basophils Absolute: 0 10*3/uL (ref 0.0–0.1)
Basophils Relative: 1 %
Eosinophils Absolute: 0.1 10*3/uL (ref 0.0–0.5)
Eosinophils Relative: 2 %
HCT: 43.1 % (ref 34.8–46.6)
HEMOGLOBIN: 14.1 g/dL (ref 11.6–15.9)
LYMPHS ABS: 1.2 10*3/uL (ref 0.9–3.3)
Lymphocytes Relative: 24 %
MCH: 29.5 pg (ref 25.1–34.0)
MCHC: 32.7 g/dL (ref 31.5–36.0)
MCV: 90.4 fL (ref 79.5–101.0)
MONO ABS: 0.4 10*3/uL (ref 0.1–0.9)
MONOS PCT: 8 %
NEUTROS PCT: 65 %
Neutro Abs: 3.4 10*3/uL (ref 1.5–6.5)
Platelet Count: 194 10*3/uL (ref 145–400)
RBC: 4.77 MIL/uL (ref 3.70–5.45)
RDW: 13.3 % (ref 11.2–14.5)
WBC Count: 5.1 10*3/uL (ref 3.9–10.3)

## 2018-08-27 MED ORDER — ANASTROZOLE 1 MG PO TABS: 1.0000 mg | ORAL_TABLET | Freq: Every day | ORAL | 3 refills | Status: DC

## 2018-08-27 MED ORDER — LORATADINE 10 MG PO TABS: 10.0000 mg | ORAL_TABLET | Freq: Every day | ORAL | Status: DC

## 2018-08-27 NOTE — Telephone Encounter (Signed)
Gave avs and calendar ° °

## 2018-08-27 NOTE — Progress Notes (Signed)
Patient Care Team: ViaLennette Bihari, MD as PCP - General (Family Medicine) Nicholas Lose, MD as Consulting Physician (Hematology and Oncology) Kyung Rudd, MD as Consulting Physician (Radiation Oncology) Delice Bison Charlestine Massed, NP as Nurse Practitioner (Hematology and Oncology)  DIAGNOSIS:  Encounter Diagnosis  Name Primary?  . Malignant neoplasm of upper-outer quadrant of right breast in female, estrogen receptor positive ([place])     SUMMARY OF ONCOLOGIC HISTORY:   Malignant neoplasm of upper-outer quadrant of right female breast ([place])   04/05/2016 Mammogram    Asymmetry in the right breast lateral middle depth, 11 mm non-circumscribed mass ([facility] mammogram)    04/05/2016 Initial Diagnosis    Right breast biopsy: Invasive ductal carcinoma ER 90-100%, PR 20%, Ki-67 20%, HER-2 Neg, T1b N0 stage IA clinical stage    05/26/2016 Surgery    Right lumpectomy: IDC with DCIS 1.6, negative margins, 2 SLN negative, ER 100%, PR 20%, Ki-67 15%, HER-2 negative ratio 1.0 T1 BN 0 stage IA pathologic stage, Oncotype DX 26, 17% ROR    06/27/2016 - 08/29/2016 Chemotherapy    Adjuvant chemotherapy with Taxotere and Cytoxan 4    10/05/2016 - 11/02/2016 Radiation Therapy    Adjuvant XRT    11/28/2016 -  Anti-estrogen oral therapy    Letrozole 1 mg daily, changed to Anastrozole in March, 2018    06/09/2017 Genetic Testing    Genetic counseling and testing for hereditary cancer syndromes performed on 05/31/2017. Results are negative for pathogenic mutations in 83 genes analyzed by [facility]'s Multi-Cancers Panel. Results are dated 06/09/2017. Genes tested: ALK, APC, ATM, AXIN2, BAP1, BARD1, BLM, BMPR1A, BRCA1, BRCA2, BRIP1, CASR, CDC73, CDH1, CDK4, CDKN1B, CDKN1C, CDKN2A, CEBPA, CHEK2, CTNNA1, DICER1, DIS3L2, EGFR, EPCAM, FH, FLCN, GATA2, GPC3, GREM1, HOXB13, HRAS, KIT, MAX, MEN1, MET, MITF, MLH1, MSH2, MSH3, MSH6, MUTYH, NBN, NF1, NF2, NTHL1, PALB2, PDGFRA, PHOX2B, PMS2, POLD1, POLE, POT1, PRKAR1A, PTCH1, PTEN,  RAD50, RAD51C, RAD51D, RB1, RECQL4, RET, RUNX1, SDHA, SDHAF2, SDHB, SDHC, SDHD, SMAD4, SMARCA4, SMARCB1, SMARCE1, STK11, SUFU, TERC, TERT, TMEM127, TP53, TSC1, TSC2, VHL, WRN, WT1.       CHIEF COMPLIANT: Follow-up on anastrozole therapy  INTERVAL HISTORY: Mercedes Nelson is a 67 year old with above-mentioned history of right breast cancer treated with lumpectomy adjuvant chemotherapy radiation is currently on anastrozole.  She is tolerating anastrozole moderately well.  Her biggest complaint is hot flashes which wake her up at night.  She denies any lumps or nodules in the breast.  REVIEW OF SYSTEMS:   Constitutional: Denies fevers, chills or abnormal weight loss Eyes: Denies blurriness of vision Ears, nose, mouth, throat, and face: Denies mucositis or sore throat Respiratory: Denies cough, dyspnea or wheezes Cardiovascular: Denies palpitation, chest discomfort Gastrointestinal:  Denies nausea, heartburn or change in bowel habits Skin: Denies abnormal skin rashes Lymphatics: Denies new lymphadenopathy or easy bruising Neurological:Denies numbness, tingling or new weaknesses Behavioral/Psych: Mood is stable, no new changes  Extremities: No lower extremity edema Breast:  denies any pain or lumps or nodules in either breasts All other systems were reviewed with the patient and are negative.  I have reviewed the past medical history, past surgical history, social history and family history with the patient and they are unchanged from previous note.  ALLERGIES:  has No Known Allergies.  MEDICATIONS:  Current Outpatient Medications  Medication Sig Dispense Refill  . anastrozole (ARIMIDEX) 1 MG tablet Take 1 tablet (1 mg total) by mouth daily. 90 tablet 3  . Calcium Carbonate-Vitamin D (CALCIUM 500/D) 500-125 MG-UNIT TABS Take 1,200 mg  by mouth 2 (two) times daily.    . Multiple Vitamin (MULTIVITAMIN) tablet Take 1 tablet by mouth daily.    . naproxen sodium (ANAPROX) 220 MG tablet Take  220 mg by mouth as needed (pain).     . simvastatin (ZOCOR) 20 MG tablet Take 20 mg by mouth daily after supper.     No current facility-administered medications for this visit.     PHYSICAL EXAMINATION: ECOG PERFORMANCE STATUS: 1 - Symptomatic but completely ambulatory  Vitals:   08/27/18 0905  BP: (!) 147/68  Pulse: 67  Resp: 18  Temp: 97.7 F (36.5 C)  SpO2: 100%   Filed Weights   08/27/18 0905  Weight: 200 lb 9.6 oz (91 kg)    GENERAL:alert, no distress and comfortable SKIN: skin color, texture, turgor are normal, no rashes or significant lesions EYES: normal, Conjunctiva are pink and non-injected, sclera clear OROPHARYNX:no exudate, no erythema and lips, buccal mucosa, and tongue normal  NECK: supple, thyroid normal size, non-tender, without nodularity LYMPH:  no palpable lymphadenopathy in the cervical, axillary or inguinal LUNGS: clear to auscultation and percussion with normal breathing effort HEART: regular rate & rhythm and no murmurs and no lower extremity edema ABDOMEN:abdomen soft, non-tender and normal bowel sounds MUSCULOSKELETAL:no cyanosis of digits and no clubbing  NEURO: alert & oriented x 3 with fluent speech, no focal motor/sensory deficits EXTREMITIES: No lower extremity edema   LABORATORY DATA:  I have reviewed the data as listed CMP Latest Ref Rng & Units 08/27/2018 08/28/2017 02/26/2017  Glucose 70 - 99 mg/dL 80 90 70  BUN 8 - 23 mg/dL 15 17.7 18.3  Creatinine 0.44 - 1.00 mg/dL 0.82 0.8 0.8  Sodium 135 - 145 mmol/L 142 142 144  Potassium 3.5 - 5.1 mmol/L 4.7 4.2 4.5  Chloride 98 - 111 mmol/L 106 - -  CO2 22 - 32 mmol/L 29 28 29   Calcium 8.9 - 10.3 mg/dL 9.9 9.9 9.7  Total Protein 6.5 - 8.1 g/dL 6.5 6.5 6.4  Total Bilirubin 0.3 - 1.2 mg/dL 0.9 1.04 1.18  Alkaline Phos 38 - 126 U/L 92 86 79  AST 15 - 41 U/L 19 18 19   ALT 0 - 44 U/L 20 20 21     Lab Results  Component Value Date   WBC 5.1 08/27/2018   HGB 14.1 08/27/2018   HCT 43.1  08/27/2018   MCV 90.4 08/27/2018   PLT 194 08/27/2018   NEUTROABS 3.4 08/27/2018    ASSESSMENT & PLAN:  Malignant neoplasm of upper-outer quadrant of right female breast ([place]) Right lumpectomy 05/26/2016: IDC with DCIS 1.6 cm, margins, 0/2 lymph nodes negative, ER 100%, PR 20%, Ki-67 15%, HER-2 negative ratio 1.0 T1 BN 0 stage IA pathologic stage Oncotype DX 26, 17% risk of recurrence  Treatment Summary: 1. Adjuvant chemotherapy with Taxotere and Cytoxan X 4 06/27/16 to 9/12/17followed by 2. Adjuvant radiation therapy 10/05/16 to 11/02/16 3. Adjuvant antiestrogen therapy with Letrozole 2.5 mg daily Started 11/28/2016 switched to anastrozole 02/26/17 (hot flashes) ------------------------------------------------------------------------------------------------------------------------------------------ Anastrozoletoxicities: 1. Occasional muscle aches 2.  hot flashes which appear to wake her up at night making it difficulty to slee structure her.  Instructed her that she could use thin clothing at bedtime.  She has experimented with the time of the day that she takes the medication it has not made a difference.  Surveillance: 1. Breast Exam: 08/27/18: Benign 2. Mammogram 04/09/18: No evidence of malignancy, breast density category C  RTC in 1 year    No orders  of the defined types were placed in this encounter.  The patient has a good understanding of the overall plan. she agrees with it. she will call with any problems that may develop before the next visit here.   Harriette Ohara, MD 08/27/18

## 2018-08-27 NOTE — Assessment & Plan Note (Signed)
Right lumpectomy 05/26/2016: IDC with DCIS 1.6 cm, margins, 0/2 lymph nodes negative, ER 100%, PR 20%, Ki-67 15%, HER-2 negative ratio 1.0 T1 BN 0 stage IA pathologic stage Oncotype DX 26, 17% risk of recurrence  Treatment Summary: 1. Adjuvant chemotherapy with Taxotere and Cytoxan X 4 06/27/16 to 9/12/17followed by 2. Adjuvant radiation therapy 10/05/16 to 11/02/16 3. Adjuvant antiestrogen therapy with Letrozole 2.5 mg daily Started 11/28/2016 switched to anastrozole 02/26/17 (hot flashes) ------------------------------------------------------------------------------------------------------------------------------------------ Anastrozoletoxicities: 1. Occasional muscle aches 2.  hot flashes  Surveillance: 1. Breast Exam: 08/27/18: Benign 2. Mammogram 04/09/18: No evidence of malignancy, breast density category C  RTC in 1 year

## 2019-01-13 DIAGNOSIS — H40013 Open angle with borderline findings, low risk, bilateral: Secondary | ICD-10-CM | POA: Diagnosis not present

## 2019-02-17 ENCOUNTER — Other Ambulatory Visit: Payer: Self-pay | Admitting: Hematology and Oncology

## 2019-02-17 DIAGNOSIS — Z853 Personal history of malignant neoplasm of breast: Secondary | ICD-10-CM

## 2019-04-11 DIAGNOSIS — Z1159 Encounter for screening for other viral diseases: Secondary | ICD-10-CM | POA: Diagnosis not present

## 2019-04-11 DIAGNOSIS — C50911 Malignant neoplasm of unspecified site of right female breast: Secondary | ICD-10-CM | POA: Diagnosis not present

## 2019-04-11 DIAGNOSIS — M85859 Other specified disorders of bone density and structure, unspecified thigh: Secondary | ICD-10-CM | POA: Diagnosis not present

## 2019-04-11 DIAGNOSIS — Z79899 Other long term (current) drug therapy: Secondary | ICD-10-CM | POA: Diagnosis not present

## 2019-04-11 DIAGNOSIS — E785 Hyperlipidemia, unspecified: Secondary | ICD-10-CM | POA: Diagnosis not present

## 2019-04-11 DIAGNOSIS — Z0000 Encounter for general adult medical examination without abnormal findings: Secondary | ICD-10-CM | POA: Diagnosis not present

## 2019-04-11 DIAGNOSIS — E049 Nontoxic goiter, unspecified: Secondary | ICD-10-CM | POA: Diagnosis not present

## 2019-05-22 ENCOUNTER — Other Ambulatory Visit: Payer: Self-pay

## 2019-05-22 ENCOUNTER — Ambulatory Visit
Admission: RE | Admit: 2019-05-22 | Discharge: 2019-05-22 | Disposition: A | Discharge status: Home / Self Care | Payer: PPO | Source: Ambulatory Visit | Attending: Hematology and Oncology | Admitting: Hematology and Oncology

## 2019-05-22 DIAGNOSIS — Z853 Personal history of malignant neoplasm of breast: Secondary | ICD-10-CM | POA: Diagnosis not present

## 2019-05-22 DIAGNOSIS — R928 Other abnormal and inconclusive findings on diagnostic imaging of breast: Secondary | ICD-10-CM | POA: Diagnosis not present

## 2019-06-23 DIAGNOSIS — E049 Nontoxic goiter, unspecified: Secondary | ICD-10-CM | POA: Diagnosis not present

## 2019-06-23 DIAGNOSIS — Z0000 Encounter for general adult medical examination without abnormal findings: Secondary | ICD-10-CM | POA: Diagnosis not present

## 2019-06-23 DIAGNOSIS — Z1159 Encounter for screening for other viral diseases: Secondary | ICD-10-CM | POA: Diagnosis not present

## 2019-06-23 DIAGNOSIS — M85859 Other specified disorders of bone density and structure, unspecified thigh: Secondary | ICD-10-CM | POA: Diagnosis not present

## 2019-06-23 DIAGNOSIS — E785 Hyperlipidemia, unspecified: Secondary | ICD-10-CM | POA: Diagnosis not present

## 2019-06-23 DIAGNOSIS — C50911 Malignant neoplasm of unspecified site of right female breast: Secondary | ICD-10-CM | POA: Diagnosis not present

## 2019-06-23 DIAGNOSIS — Z79899 Other long term (current) drug therapy: Secondary | ICD-10-CM | POA: Diagnosis not present

## 2019-08-22 DIAGNOSIS — H40013 Open angle with borderline findings, low risk, bilateral: Secondary | ICD-10-CM | POA: Diagnosis not present

## 2019-08-22 DIAGNOSIS — Z83511 Family history of glaucoma: Secondary | ICD-10-CM | POA: Diagnosis not present

## 2019-08-27 NOTE — Progress Notes (Signed)
Patient Care Team: Via, Lennette Bihari, MD as PCP - General (Family Medicine) Nicholas Lose, MD as Consulting Physician (Hematology and Oncology) Kyung Rudd, MD as Consulting Physician (Radiation Oncology) Gardenia Phlegm, NP as Nurse Practitioner (Hematology and Oncology)  DIAGNOSIS:    ICD-10-CM   1. Malignant neoplasm of upper-outer quadrant of right breast in female, estrogen receptor positive ([place])  C50.411    Z17.0     SUMMARY OF ONCOLOGIC HISTORY: Oncology History  Malignant neoplasm of upper-outer quadrant of right female breast ([place])  04/05/2016 Mammogram   Asymmetry in the right breast lateral middle depth, 11 mm non-circumscribed mass ([facility] mammogram)   04/05/2016 Initial Diagnosis   Right breast biopsy: Invasive ductal carcinoma ER 90-100%, PR 20%, Ki-67 20%, HER-2 Neg, T1b N0 stage IA clinical stage   05/26/2016 Surgery   Right lumpectomy: IDC with DCIS 1.6, negative margins, 2 SLN negative, ER 100%, PR 20%, Ki-67 15%, HER-2 negative ratio 1.0 T1 BN 0 stage IA pathologic stage, Oncotype DX 26, 17% ROR   06/27/2016 - 08/29/2016 Chemotherapy   Adjuvant chemotherapy with Taxotere and Cytoxan 4   10/05/2016 - 11/02/2016 Radiation Therapy   Adjuvant XRT   11/28/2016 -  Anti-estrogen oral therapy   Letrozole 1 mg daily, changed to Anastrozole in March, 2018   06/09/2017 Genetic Testing   Genetic counseling and testing for hereditary cancer syndromes performed on 05/31/2017. Results are negative for pathogenic mutations in 83 genes analyzed by [facility]'s Multi-Cancers Panel. Results are dated 06/09/2017. Genes tested: ALK, APC, ATM, AXIN2, BAP1, BARD1, BLM, BMPR1A, BRCA1, BRCA2, BRIP1, CASR, CDC73, CDH1, CDK4, CDKN1B, CDKN1C, CDKN2A, CEBPA, CHEK2, CTNNA1, DICER1, DIS3L2, EGFR, EPCAM, FH, FLCN, GATA2, GPC3, GREM1, HOXB13, HRAS, KIT, MAX, MEN1, MET, MITF, MLH1, MSH2, MSH3, MSH6, MUTYH, NBN, NF1, NF2, NTHL1, PALB2, PDGFRA, PHOX2B, PMS2, POLD1, POLE, POT1, PRKAR1A, PTCH1, PTEN,  RAD50, RAD51C, RAD51D, RB1, RECQL4, RET, RUNX1, SDHA, SDHAF2, SDHB, SDHC, SDHD, SMAD4, SMARCA4, SMARCB1, SMARCE1, STK11, SUFU, TERC, TERT, TMEM127, TP53, TSC1, TSC2, VHL, WRN, WT1.       CHIEF COMPLIANT: Follow-up of right breast cancer on anastrozole therapy  INTERVAL HISTORY: Mercedes Nelson is a 68 y.o. with above-mentioned history of right breast cancer treated with lumpectomy, adjuvant chemotherapy, radiation, and who is currently on anastrozole. I last saw her a year ago. Mammogram on 05/22/19 showed no evidence of malignancy bilaterally. She presents to the clinic today for annual follow-up.   REVIEW OF SYSTEMS:   Constitutional: Denies fevers, chills or abnormal weight loss Eyes: Denies blurriness of vision Ears, nose, mouth, throat, and face: Denies mucositis or sore throat Respiratory: Denies cough, dyspnea or wheezes Cardiovascular: Denies palpitation, chest discomfort Gastrointestinal: Denies nausea, heartburn or change in bowel habits Skin: Denies abnormal skin rashes Lymphatics: Denies new lymphadenopathy or easy bruising Neurological: Denies numbness, tingling or new weaknesses Behavioral/Psych: Mood is stable, no new changes  Extremities: No lower extremity edema Breast: denies any pain or lumps or nodules in either breasts All other systems were reviewed with the patient and are negative.  I have reviewed the past medical history, past surgical history, social history and family history with the patient and they are unchanged from previous note.  ALLERGIES:  has No Known Allergies.  MEDICATIONS:  Current Outpatient Medications  Medication Sig Dispense Refill   anastrozole (ARIMIDEX) 1 MG tablet Take 1 tablet (1 mg total) by mouth daily. 90 tablet 3   Calcium Carbonate-Vitamin D (CALCIUM 500/D) 500-125 MG-UNIT TABS Take 1,200 mg by mouth 2 (two) times daily.  loratadine (CLARITIN) 10 MG tablet Take 1 tablet (10 mg total) by mouth daily.     Multiple Vitamin  (MULTIVITAMIN) tablet Take 1 tablet by mouth daily.     simvastatin (ZOCOR) 20 MG tablet Take 20 mg by mouth daily after supper.     No current facility-administered medications for this visit.     PHYSICAL EXAMINATION: ECOG PERFORMANCE STATUS: 1 - Symptomatic but completely ambulatory  Vitals:   08/28/19 1040  BP: 136/74  Pulse: 65  Resp: 17  Temp: 98.9 F (37.2 C)  SpO2: 97%   Filed Weights   08/28/19 1040  Weight: 205 lb 8 oz (93.2 kg)    GENERAL: alert, no distress and comfortable SKIN: skin color, texture, turgor are normal, no rashes or significant lesions EYES: normal, Conjunctiva are pink and non-injected, sclera clear OROPHARYNX: no exudate, no erythema and lips, buccal mucosa, and tongue normal  NECK: supple, thyroid normal size, non-tender, without nodularity LYMPH: no palpable lymphadenopathy in the cervical, axillary or inguinal LUNGS: clear to auscultation and percussion with normal breathing effort HEART: regular rate & rhythm and no murmurs and no lower extremity edema ABDOMEN: abdomen soft, non-tender and normal bowel sounds MUSCULOSKELETAL: no cyanosis of digits and no clubbing  NEURO: alert & oriented x 3 with fluent speech, no focal motor/sensory deficits EXTREMITIES: No lower extremity edema BREAST: No palpable masses or nodules in either right or left breasts. No palpable axillary supraclavicular or infraclavicular adenopathy no breast tenderness or nipple discharge. (exam performed in the presence of a chaperone)  LABORATORY DATA:  I have reviewed the data as listed CMP Latest Ref Rng & Units 08/27/2018 08/28/2017 02/26/2017  Glucose 70 - 99 mg/dL 80 90 70  BUN 8 - 23 mg/dL 15 17.7 18.3  Creatinine 0.44 - 1.00 mg/dL 0.82 0.8 0.8  Sodium 135 - 145 mmol/L 142 142 144  Potassium 3.5 - 5.1 mmol/L 4.7 4.2 4.5  Chloride 98 - 111 mmol/L 106 - -  CO2 22 - 32 mmol/L _0 Calcium 8.9 - 10.3 mg/dL 9.9 9.9 9.7  Total Protein 6.5 - 8.1 g/dL 6.5 6.5 6.4    Total Bilirubin 0.3 - 1.2 mg/dL 0.9 1.04 1.18  Alkaline Phos 38 - 126 U/L 92 86 79  AST 15 - 41 U/L _1 ALT 0 - 44 U/L _2 Lab Results  Component Value Date   WBC 5.1 08/27/2018   HGB 14.1 08/27/2018   HCT 43.1 08/27/2018   MCV 90.4 08/27/2018   PLT 194 08/27/2018   NEUTROABS 3.4 08/27/2018    ASSESSMENT & PLAN:  Malignant neoplasm of upper-outer quadrant of right female breast ([place]) Right lumpectomy 05/26/2016: IDC with DCIS 1.6 cm, margins, 0/2 lymph nodes negative, ER 100%, PR 20%, Ki-67 15%, HER-2 negative ratio 1.0 T1 BN 0 stage IA pathologic stage Oncotype DX 26, 17% risk of recurrence  Treatment Summary: 1. Adjuvant chemotherapy with Taxotere and Cytoxan X 4 06/27/16 to 9/12/17followed by 2. Adjuvant radiation therapy 10/05/16 to 11/02/16 3. Adjuvant antiestrogen therapy with Letrozole 2.5 mg daily Started 12/12/2017switched to anastrozole 02/26/17 (hot flashes) ------------------------------------------------------------------------------------------------------------------------------------------ Anastrozoletoxicities: 1.Occasional muscle aches 2.hot flashes which appear to wake her up at night  I renewed her prescription for anastrozole.  Surveillance: 1. Breast Exam: 08/28/2019: Benign 2. Mammogram 05/22/2019: No evidence of malignancy, breast density category B  RTC in 1 year    No orders of the defined types were placed in this encounter.  The patient  has a good understanding of the overall plan. she agrees with it. she will call with any problems that may develop before the next visit here.  Nicholas Lose, MD 08/28/2019  Julious Oka Dorshimer am acting as scribe for Dr. Nicholas Lose.  I have reviewed the above documentation for accuracy and completeness, and I agree with the above.

## 2019-08-28 ENCOUNTER — Other Ambulatory Visit: Payer: Self-pay

## 2019-08-28 ENCOUNTER — Inpatient Hospital Stay: Payer: PPO | Attending: Hematology and Oncology | Admitting: Hematology and Oncology

## 2019-08-28 DIAGNOSIS — Z79899 Other long term (current) drug therapy: Secondary | ICD-10-CM | POA: Diagnosis not present

## 2019-08-28 DIAGNOSIS — Z79811 Long term (current) use of aromatase inhibitors: Secondary | ICD-10-CM | POA: Diagnosis not present

## 2019-08-28 DIAGNOSIS — Z170 Estrogen receptor positive status [ER+]: Secondary | ICD-10-CM | POA: Insufficient documentation

## 2019-08-28 DIAGNOSIS — Z9221 Personal history of antineoplastic chemotherapy: Secondary | ICD-10-CM | POA: Diagnosis not present

## 2019-08-28 DIAGNOSIS — N951 Menopausal and female climacteric states: Secondary | ICD-10-CM | POA: Diagnosis not present

## 2019-08-28 DIAGNOSIS — C50411 Malignant neoplasm of upper-outer quadrant of right female breast: Secondary | ICD-10-CM | POA: Diagnosis not present

## 2019-08-28 DIAGNOSIS — Z923 Personal history of irradiation: Secondary | ICD-10-CM | POA: Diagnosis not present

## 2019-08-28 MED ORDER — ANASTROZOLE 1 MG PO TABS: 1.0000 mg | ORAL_TABLET | Freq: Every day | ORAL | 3 refills | Status: DC

## 2019-08-28 NOTE — Assessment & Plan Note (Signed)
Right lumpectomy 05/26/2016: IDC with DCIS 1.6 cm, margins, 0/2 lymph nodes negative, ER 100%, PR 20%, Ki-67 15%, HER-2 negative ratio 1.0 T1 BN 0 stage IA pathologic stage Oncotype DX 26, 17% risk of recurrence  Treatment Summary: 1. Adjuvant chemotherapy with Taxotere and Cytoxan X 4 06/27/16 to 9/12/17followed by 2. Adjuvant radiation therapy 10/05/16 to 11/02/16 3. Adjuvant antiestrogen therapy with Letrozole 2.5 mg daily Started 12/12/2017switched to anastrozole 02/26/17 (hot flashes) ------------------------------------------------------------------------------------------------------------------------------------------ Anastrozoletoxicities: 1.Occasional muscle aches 2.hot flashes which appear to wake her up at night making it difficulty to slee structure her.  Instructed her that she could use thin clothing at bedtime.  She has experimented with the time of the day that she takes the medication it has not made a difference.  Surveillance: 1. Breast Exam: 08/28/2019: Benign 2. Mammogram 05/22/2019: No evidence of malignancy, breast density category B  RTC in 1 year

## 2019-10-17 DIAGNOSIS — Z23 Encounter for immunization: Secondary | ICD-10-CM | POA: Diagnosis not present

## 2020-01-26 ENCOUNTER — Ambulatory Visit: Payer: PPO

## 2020-01-31 DIAGNOSIS — Z03818 Encounter for observation for suspected exposure to other biological agents ruled out: Secondary | ICD-10-CM | POA: Diagnosis not present

## 2020-02-19 DIAGNOSIS — H40013 Open angle with borderline findings, low risk, bilateral: Secondary | ICD-10-CM | POA: Diagnosis not present

## 2020-04-08 ENCOUNTER — Other Ambulatory Visit: Payer: Self-pay | Admitting: Hematology and Oncology

## 2020-04-08 DIAGNOSIS — Z853 Personal history of malignant neoplasm of breast: Secondary | ICD-10-CM

## 2020-05-19 ENCOUNTER — Other Ambulatory Visit: Payer: Self-pay

## 2020-05-19 ENCOUNTER — Ambulatory Visit: Payer: PPO | Admitting: Podiatry

## 2020-05-19 DIAGNOSIS — E669 Obesity, unspecified: Secondary | ICD-10-CM | POA: Insufficient documentation

## 2020-05-19 DIAGNOSIS — E559 Vitamin D deficiency, unspecified: Secondary | ICD-10-CM | POA: Insufficient documentation

## 2020-05-19 DIAGNOSIS — L851 Acquired keratosis [keratoderma] palmaris et plantaris: Secondary | ICD-10-CM | POA: Diagnosis not present

## 2020-05-19 DIAGNOSIS — E782 Mixed hyperlipidemia: Secondary | ICD-10-CM | POA: Insufficient documentation

## 2020-05-23 NOTE — Progress Notes (Signed)
   Subjective: Patient presents to the office today for follow up evaluation of painful callus lesions of the bilateral feet. She states that the pain has been ongoing for several years and is affecting her ability to ambulate without pain. Patient presents today for further treatment and evaluation.   Past Medical History:  Diagnosis Date  . Breast cancer ([facility]) 04/20/16   Right breast invasive ductal ca  . Genetic testing 06/11/2017   Ms. Mohammad underwent genetic counseling and testing for hereditary cancer syndromes on 05/31/2017. Her results were negative for mutations in all 83 genes analyzed by [facility]'s 83-gene Multi-Cancers Panel. Genes analyzed: ALK, APC, ATM, AXIN2, BAP1, BARD1, BLM, BMPR1A, BRCA1, BRCA2, BRIP1, CASR, CDC73, CDH1, CDK4, CDKN1B, CDKN1C, CDKN2A, CEBPA, CHEK2, CTNNA1, DICER1, DIS3L2, EGFR, EPCAM, FH, FLCN, G  . Goiter   . Headache april 2017   "migraine and see starry thing" vision is affected, last 30 minutes  . History of urinary tract infection   . Personal history of chemotherapy   . Personal history of radiation therapy   . PONV (postoperative nausea and vomiting)    "worse headache of my life", headache 12 hours after procedure  . Thyroid disease      Objective:  Physical Exam General: Alert and oriented x3 in no acute distress  Dermatology: Hyperkeratotic lesion present on the bilateral sub-fifth metatarsal. Pain on palpation with a central nucleated core noted.  Skin is warm, dry and supple bilateral lower extremities. Negative for open lesions or macerations.  Vascular: Palpable pedal pulses bilaterally. No edema or erythema noted. Capillary refill within normal limits.  Neurological: Epicritic and protective threshold grossly intact bilaterally.   Musculoskeletal Exam: Pain on palpation at the keratotic lesion noted. Range of motion within normal limits bilateral. Muscle strength 5/5 in all groups bilateral.  Assessment: #1 porokeratosis bilateral  sub-fifth metatarsal   Plan of Care:  #1 Patient evaluated #2 Excisional debridement of  keratoic lesion using a chisel blade was performed without incident.  #3 Treated area(s) with Salinocaine and dressed with light dressing. #4 Patient is to return to the clinic PRN.   Rheba Diamond M. Noura Purpura, DPM [facility]  Dr. Rihana Kiddy M. Thorne Wirz, DPM    [address]                                        [place], [place] [address]                Office [phone]  Fax [phone]      

## 2020-05-24 ENCOUNTER — Other Ambulatory Visit: Payer: Self-pay | Admitting: Adult Health

## 2020-05-24 DIAGNOSIS — Z853 Personal history of malignant neoplasm of breast: Secondary | ICD-10-CM

## 2020-05-25 ENCOUNTER — Ambulatory Visit
Admission: RE | Admit: 2020-05-25 | Discharge: 2020-05-25 | Disposition: A | Discharge status: Home / Self Care | Payer: PPO | Source: Ambulatory Visit | Attending: Hematology and Oncology | Admitting: Hematology and Oncology

## 2020-05-25 ENCOUNTER — Other Ambulatory Visit: Payer: Self-pay

## 2020-05-25 DIAGNOSIS — R928 Other abnormal and inconclusive findings on diagnostic imaging of breast: Secondary | ICD-10-CM | POA: Diagnosis not present

## 2020-05-25 DIAGNOSIS — Z853 Personal history of malignant neoplasm of breast: Secondary | ICD-10-CM | POA: Diagnosis not present

## 2020-06-18 DIAGNOSIS — Z131 Encounter for screening for diabetes mellitus: Secondary | ICD-10-CM | POA: Diagnosis not present

## 2020-06-18 DIAGNOSIS — E669 Obesity, unspecified: Secondary | ICD-10-CM | POA: Diagnosis not present

## 2020-06-18 DIAGNOSIS — E785 Hyperlipidemia, unspecified: Secondary | ICD-10-CM | POA: Diagnosis not present

## 2020-06-18 DIAGNOSIS — M859 Disorder of bone density and structure, unspecified: Secondary | ICD-10-CM | POA: Diagnosis not present

## 2020-06-18 DIAGNOSIS — E049 Nontoxic goiter, unspecified: Secondary | ICD-10-CM | POA: Diagnosis not present

## 2020-06-18 DIAGNOSIS — Z0000 Encounter for general adult medical examination without abnormal findings: Secondary | ICD-10-CM | POA: Diagnosis not present

## 2020-06-18 DIAGNOSIS — Z6834 Body mass index (BMI) 34.0-34.9, adult: Secondary | ICD-10-CM | POA: Diagnosis not present

## 2020-06-18 DIAGNOSIS — C50911 Malignant neoplasm of unspecified site of right female breast: Secondary | ICD-10-CM | POA: Diagnosis not present

## 2020-06-24 ENCOUNTER — Other Ambulatory Visit: Payer: Self-pay | Admitting: Family Medicine

## 2020-06-24 DIAGNOSIS — M8580 Other specified disorders of bone density and structure, unspecified site: Secondary | ICD-10-CM

## 2020-08-26 NOTE — Progress Notes (Signed)
Patient Care Team: Via, Lennette Bihari, MD as PCP - General (Family Medicine) Nicholas Lose, MD as Consulting Physician (Hematology and Oncology) Kyung Rudd, MD as Consulting Physician (Radiation Oncology) Gardenia Phlegm, NP as Nurse Practitioner (Hematology and Oncology)  DIAGNOSIS:    ICD-10-CM   1. Malignant neoplasm of upper-outer quadrant of right breast in female, estrogen receptor positive ([place])  C50.411    Z17.0     SUMMARY OF ONCOLOGIC HISTORY: Oncology History  Malignant neoplasm of upper-outer quadrant of right female breast ([place])  04/05/2016 Mammogram   Asymmetry in the right breast lateral middle depth, 11 mm non-circumscribed mass ([facility] mammogram)   04/05/2016 Initial Diagnosis   Right breast biopsy: Invasive ductal carcinoma ER 90-100%, PR 20%, Ki-67 20%, HER-2 Neg, T1b N0 stage IA clinical stage   05/26/2016 Surgery   Right lumpectomy: IDC with DCIS 1.6, negative margins, 2 SLN negative, ER 100%, PR 20%, Ki-67 15%, HER-2 negative ratio 1.0 T1 BN 0 stage IA pathologic stage, Oncotype DX 26, 17% ROR   06/27/2016 - 08/29/2016 Chemotherapy   Adjuvant chemotherapy with Taxotere and Cytoxan 4   10/05/2016 - 11/02/2016 Radiation Therapy   Adjuvant XRT   11/28/2016 -  Anti-estrogen oral therapy   Letrozole 1 mg daily, changed to Anastrozole in March, 2018   06/09/2017 Genetic Testing   Genetic counseling and testing for hereditary cancer syndromes performed on 05/31/2017. Results are negative for pathogenic mutations in 83 genes analyzed by [facility]'s Multi-Cancers Panel. Results are dated 06/09/2017. Genes tested: ALK, APC, ATM, AXIN2, BAP1, BARD1, BLM, BMPR1A, BRCA1, BRCA2, BRIP1, CASR, CDC73, CDH1, CDK4, CDKN1B, CDKN1C, CDKN2A, CEBPA, CHEK2, CTNNA1, DICER1, DIS3L2, EGFR, EPCAM, FH, FLCN, GATA2, GPC3, GREM1, HOXB13, HRAS, KIT, MAX, MEN1, MET, MITF, MLH1, MSH2, MSH3, MSH6, MUTYH, NBN, NF1, NF2, NTHL1, PALB2, PDGFRA, PHOX2B, PMS2, POLD1, POLE, POT1, PRKAR1A, PTCH1, PTEN,  RAD50, RAD51C, RAD51D, RB1, RECQL4, RET, RUNX1, SDHA, SDHAF2, SDHB, SDHC, SDHD, SMAD4, SMARCA4, SMARCB1, SMARCE1, STK11, SUFU, TERC, TERT, TMEM127, TP53, TSC1, TSC2, VHL, WRN, WT1.       CHIEF COMPLIANT: Follow-up of right breast cancer on anastrozole therapy  INTERVAL HISTORY: Mercedes Nelson is a 69 y.o. with above-mentioned history of right breast cancer treated with lumpectomy, adjuvant chemotherapy, radiation, and who is currently on anastrozole. Mammogram on 05/25/20 showed no evidence of malignancy bilaterally. She presents to the clinic today for annual follow-up.   She is tolerating anastrozole fairly well.  Denies any lumps or nodules in the breast.  She has gained significant weight.  She has not been exercising.  ALLERGIES:  is allergic to no known allergies.  MEDICATIONS:  Current Outpatient Medications  Medication Sig Dispense Refill  . anastrozole (ARIMIDEX) 1 MG tablet Take 1 tablet (1 mg total) by mouth daily. 90 tablet 3  . Calcium Carbonate-Vitamin D (CALCIUM 500/D) 500-125 MG-UNIT TABS Take 1,200 mg by mouth 2 (two) times daily.    Marland Kitchen loratadine (CLARITIN) 10 MG tablet Take 1 tablet (10 mg total) by mouth daily.    . Multiple Vitamin (MULTIVITAMIN) tablet Take 1 tablet by mouth daily.    . simvastatin (ZOCOR) 20 MG tablet Take 20 mg by mouth daily after supper.     No current facility-administered medications for this visit.    PHYSICAL EXAMINATION: ECOG PERFORMANCE STATUS: 1 - Symptomatic but completely ambulatory  Vitals:   08/27/20 1055  BP: (!) 152/67  Pulse: 63  Resp: 18  Temp: (!) 97.5 F (36.4 C)  SpO2: 100%   Filed Weights   08/27/20  1055  Weight: 211 lb 9.6 oz (96 kg)    BREAST: No palpable masses or nodules in either right or left breasts. No palpable axillary supraclavicular or infraclavicular adenopathy no breast tenderness or nipple discharge. (exam performed in the presence of a chaperone)  LABORATORY DATA:  I have reviewed the data as  listed CMP Latest Ref Rng & Units 08/27/2018 08/28/2017 02/26/2017  Glucose 70 - 99 mg/dL 80 90 70  BUN 8 - 23 mg/dL 15 17.7 18.3  Creatinine 0.44 - 1.00 mg/dL 0.82 0.8 0.8  Sodium 135 - 145 mmol/L 142 142 144  Potassium 3.5 - 5.1 mmol/L 4.7 4.2 4.5  Chloride 98 - 111 mmol/L 106 - -  CO2 22 - 32 mmol/L 29 28 29   Calcium 8.9 - 10.3 mg/dL 9.9 9.9 9.7  Total Protein 6.5 - 8.1 g/dL 6.5 6.5 6.4  Total Bilirubin 0.3 - 1.2 mg/dL 0.9 1.04 1.18  Alkaline Phos 38 - 126 U/L 92 86 79  AST 15 - 41 U/L 19 18 19   ALT 0 - 44 U/L 20 20 21     Lab Results  Component Value Date   WBC 5.1 08/27/2018   HGB 14.1 08/27/2018   HCT 43.1 08/27/2018   MCV 90.4 08/27/2018   PLT 194 08/27/2018   NEUTROABS 3.4 08/27/2018    ASSESSMENT & PLAN:  Malignant neoplasm of upper-outer quadrant of right female breast ([place]) Right lumpectomy 05/26/2016: IDC with DCIS 1.6 cm, margins, 0/2 lymph nodes negative, ER 100%, PR 20%, Ki-67 15%, HER-2 negative ratio 1.0 T1 BN 0 stage IA pathologic stage Oncotype DX 26, 17% risk of recurrence  Treatment Summary: 1. Adjuvant chemotherapy with Taxotere and Cytoxan X 4 06/27/16 to 9/12/17followed by 2. Adjuvant radiation therapy 10/05/16 to 11/02/16 3. Adjuvant antiestrogen therapy with Letrozole 2.5 mg daily Started 12/12/2017switched to anastrozole 02/26/17 (hot flashes) ------------------------------------------------------------------------------------------------------------------------------------------ Anastrozoletoxicities: 1.Occasional muscle aches 2.hot flashes have improved I renewed her prescription for anastrozole. Our plan is to treat her for 7 years  Surveillance: 1. Breast Exam:  08/27/2020: Benign 2. Mammogram  05/25/2020:No evidence of malignancy, breast density category B Bone density scheduled for October 2021  Weight gain: I discussed with her that she needs to work on losing a few pounds.  She will make an effort to exercise and watch her  diet. Hypertension: She tells me that her blood pressure is excellent at home. RTC in 1 year    No orders of the defined types were placed in this encounter.  The patient has a good understanding of the overall plan. she agrees with it. she will call with any problems that may develop before the next visit here.  Total time spent: 20 mins including face to face time and time spent for planning, charting and coordination of care  Nicholas Lose, MD 08/27/2020  I, Cloyde Reams Dorshimer, am acting as scribe for Dr. Nicholas Lose.  I have reviewed the above documentation for accuracy and completeness, and I agree with the above.

## 2020-08-27 ENCOUNTER — Other Ambulatory Visit: Payer: Self-pay

## 2020-08-27 ENCOUNTER — Inpatient Hospital Stay: Payer: PPO | Attending: Hematology and Oncology | Admitting: Hematology and Oncology

## 2020-08-27 DIAGNOSIS — Z79811 Long term (current) use of aromatase inhibitors: Secondary | ICD-10-CM | POA: Diagnosis not present

## 2020-08-27 DIAGNOSIS — Z170 Estrogen receptor positive status [ER+]: Secondary | ICD-10-CM | POA: Diagnosis not present

## 2020-08-27 DIAGNOSIS — Z923 Personal history of irradiation: Secondary | ICD-10-CM | POA: Diagnosis not present

## 2020-08-27 DIAGNOSIS — Z9221 Personal history of antineoplastic chemotherapy: Secondary | ICD-10-CM | POA: Insufficient documentation

## 2020-08-27 DIAGNOSIS — I10 Essential (primary) hypertension: Secondary | ICD-10-CM | POA: Diagnosis not present

## 2020-08-27 DIAGNOSIS — Z79899 Other long term (current) drug therapy: Secondary | ICD-10-CM | POA: Insufficient documentation

## 2020-08-27 DIAGNOSIS — R232 Flushing: Secondary | ICD-10-CM | POA: Insufficient documentation

## 2020-08-27 DIAGNOSIS — C50411 Malignant neoplasm of upper-outer quadrant of right female breast: Secondary | ICD-10-CM | POA: Diagnosis not present

## 2020-08-27 DIAGNOSIS — N951 Menopausal and female climacteric states: Secondary | ICD-10-CM | POA: Insufficient documentation

## 2020-08-27 MED ORDER — ANASTROZOLE 1 MG PO TABS: 1.0000 mg | ORAL_TABLET | Freq: Every day | ORAL | 3 refills | Status: DC

## 2020-08-27 NOTE — Assessment & Plan Note (Signed)
Right lumpectomy 05/26/2016: IDC with DCIS 1.6 cm, margins, 0/2 lymph nodes negative, ER 100%, PR 20%, Ki-67 15%, HER-2 negative ratio 1.0 T1 BN 0 stage IA pathologic stage Oncotype DX 26, 17% risk of recurrence  Treatment Summary: 1. Adjuvant chemotherapy with Taxotere and Cytoxan X 4 06/27/16 to 9/12/17followed by 2. Adjuvant radiation therapy 10/05/16 to 11/02/16 3. Adjuvant antiestrogen therapy with Letrozole 2.5 mg daily Started 12/12/2017switched to anastrozole 02/26/17 (hot flashes) ------------------------------------------------------------------------------------------------------------------------------------------ Anastrozoletoxicities: 1.Occasional muscle aches 2.hot flasheswhich appear to wake her up at night  I renewed her prescription for anastrozole.  Surveillance: 1. Breast Exam:  08/27/2020: Benign 2. Mammogram  05/25/2020:No evidence of malignancy, breast density category B Bone density scheduled for October 2021  RTC in 1 year

## 2020-09-21 DIAGNOSIS — H40013 Open angle with borderline findings, low risk, bilateral: Secondary | ICD-10-CM | POA: Diagnosis not present

## 2020-09-22 ENCOUNTER — Ambulatory Visit
Admission: RE | Admit: 2020-09-22 | Discharge: 2020-09-22 | Disposition: A | Discharge status: Home / Self Care | Payer: PPO | Source: Ambulatory Visit | Attending: Family Medicine | Admitting: Family Medicine

## 2020-09-22 ENCOUNTER — Other Ambulatory Visit: Payer: Self-pay

## 2020-09-22 DIAGNOSIS — M8580 Other specified disorders of bone density and structure, unspecified site: Secondary | ICD-10-CM

## 2020-09-22 DIAGNOSIS — M85852 Other specified disorders of bone density and structure, left thigh: Secondary | ICD-10-CM | POA: Diagnosis not present

## 2020-09-22 DIAGNOSIS — Z780 Asymptomatic menopausal state: Secondary | ICD-10-CM | POA: Diagnosis not present

## 2021-01-25 DIAGNOSIS — H40013 Open angle with borderline findings, low risk, bilateral: Secondary | ICD-10-CM | POA: Diagnosis not present

## 2021-04-05 ENCOUNTER — Other Ambulatory Visit: Payer: Self-pay | Admitting: Family Medicine

## 2021-04-05 DIAGNOSIS — Z1231 Encounter for screening mammogram for malignant neoplasm of breast: Secondary | ICD-10-CM

## 2021-05-26 ENCOUNTER — Ambulatory Visit
Admission: RE | Admit: 2021-05-26 | Discharge: 2021-05-26 | Disposition: A | Discharge status: Home / Self Care | Payer: PPO | Source: Ambulatory Visit | Attending: Family Medicine | Admitting: Family Medicine

## 2021-05-26 ENCOUNTER — Other Ambulatory Visit: Payer: Self-pay

## 2021-05-26 DIAGNOSIS — Z1231 Encounter for screening mammogram for malignant neoplasm of breast: Secondary | ICD-10-CM

## 2021-07-04 DIAGNOSIS — Z1389 Encounter for screening for other disorder: Secondary | ICD-10-CM | POA: Diagnosis not present

## 2021-07-04 DIAGNOSIS — C50911 Malignant neoplasm of unspecified site of right female breast: Secondary | ICD-10-CM | POA: Diagnosis not present

## 2021-07-04 DIAGNOSIS — E049 Nontoxic goiter, unspecified: Secondary | ICD-10-CM | POA: Diagnosis not present

## 2021-07-04 DIAGNOSIS — Z6833 Body mass index (BMI) 33.0-33.9, adult: Secondary | ICD-10-CM | POA: Diagnosis not present

## 2021-07-04 DIAGNOSIS — M85859 Other specified disorders of bone density and structure, unspecified thigh: Secondary | ICD-10-CM | POA: Diagnosis not present

## 2021-07-04 DIAGNOSIS — E669 Obesity, unspecified: Secondary | ICD-10-CM | POA: Diagnosis not present

## 2021-07-04 DIAGNOSIS — Z0000 Encounter for general adult medical examination without abnormal findings: Secondary | ICD-10-CM | POA: Diagnosis not present

## 2021-07-04 DIAGNOSIS — R110 Nausea: Secondary | ICD-10-CM | POA: Diagnosis not present

## 2021-07-04 DIAGNOSIS — E785 Hyperlipidemia, unspecified: Secondary | ICD-10-CM | POA: Diagnosis not present

## 2021-08-09 DIAGNOSIS — Z20822 Contact with and (suspected) exposure to covid-19: Secondary | ICD-10-CM | POA: Diagnosis not present

## 2021-08-26 ENCOUNTER — Ambulatory Visit: Payer: PPO | Admitting: Hematology and Oncology

## 2021-08-31 NOTE — Progress Notes (Signed)
Patient Care Team: Kathyrn Lass, MD as PCP - General (Family Medicine) Nicholas Lose, MD as Consulting Physician (Hematology and Oncology) Kyung Rudd, MD as Consulting Physician (Radiation Oncology) Gardenia Phlegm, NP as Nurse Practitioner (Hematology and Oncology)  DIAGNOSIS:    ICD-10-CM   1. Malignant neoplasm of upper-outer quadrant of right breast in female, estrogen receptor positive ([place])  C50.411    Z17.0       SUMMARY OF ONCOLOGIC HISTORY: Oncology History  Malignant neoplasm of upper-outer quadrant of right female breast ([place])  04/05/2016 Mammogram   Asymmetry in the right breast lateral middle depth, 11 mm non-circumscribed mass ([facility] mammogram)   04/05/2016 Initial Diagnosis   Right breast biopsy: Invasive ductal carcinoma ER 90-100%, PR 20%, Ki-67 20%, HER-2 Neg, T1b N0 stage IA clinical stage   05/26/2016 Surgery   Right lumpectomy: IDC with DCIS 1.6, negative margins, 2 SLN negative, ER 100%, PR 20%, Ki-67 15%, HER-2 negative ratio 1.0 T1 BN 0 stage IA pathologic stage, Oncotype DX 26, 17% ROR   06/27/2016 - 08/29/2016 Chemotherapy   Adjuvant chemotherapy with Taxotere and Cytoxan 4   10/05/2016 - 11/02/2016 Radiation Therapy   Adjuvant XRT   11/28/2016 -  Anti-estrogen oral therapy   Letrozole 1 mg daily, changed to Anastrozole in March, 2018   06/09/2017 Genetic Testing   Genetic counseling and testing for hereditary cancer syndromes performed on 05/31/2017. Results are negative for pathogenic mutations in 83 genes analyzed by [facility]'s Multi-Cancers Panel. Results are dated 06/09/2017. Genes tested: ALK, APC, ATM, AXIN2, BAP1, BARD1, BLM, BMPR1A, BRCA1, BRCA2, BRIP1, CASR, CDC73, CDH1, CDK4, CDKN1B, CDKN1C, CDKN2A, CEBPA, CHEK2, CTNNA1, DICER1, DIS3L2, EGFR, EPCAM, FH, FLCN, GATA2, GPC3, GREM1, HOXB13, HRAS, KIT, MAX, MEN1, MET, MITF, MLH1, MSH2, MSH3, MSH6, MUTYH, NBN, NF1, NF2, NTHL1, PALB2, PDGFRA, PHOX2B, PMS2, POLD1, POLE, POT1, PRKAR1A, PTCH1,  PTEN, RAD50, RAD51C, RAD51D, RB1, RECQL4, RET, RUNX1, SDHA, SDHAF2, SDHB, SDHC, SDHD, SMAD4, SMARCA4, SMARCB1, SMARCE1, STK11, SUFU, TERC, TERT, TMEM127, TP53, TSC1, TSC2, VHL, WRN, WT1.       CHIEF COMPLIANT: Follow-up of right breast cancer on anastrozole therapy  INTERVAL HISTORY: Mercedes Nelson is a 70 y.o. with above-mentioned history of right breast cancer treated with lumpectomy, adjuvant chemotherapy, radiation, and who is currently on anastrozole. Mammogram on 05/26/2021 showed no evidence of malignancy bilaterally. She presents to the clinic today for annual follow-up.  She has 4-5 hot flashes every day.  ALLERGIES:  is allergic to no known allergies.  MEDICATIONS:  Current Outpatient Medications  Medication Sig Dispense Refill   anastrozole (ARIMIDEX) 1 MG tablet Take 1 tablet (1 mg total) by mouth daily. 90 tablet 3   Calcium Carbonate-Vitamin D (CALCIUM 500/D) 500-125 MG-UNIT TABS Take 1,200 mg by mouth 2 (two) times daily.     loratadine (CLARITIN) 10 MG tablet Take 1 tablet (10 mg total) by mouth daily.     Multiple Vitamin (MULTIVITAMIN) tablet Take 1 tablet by mouth daily.     simvastatin (ZOCOR) 20 MG tablet Take 20 mg by mouth daily after supper.     No current facility-administered medications for this visit.    PHYSICAL EXAMINATION: ECOG PERFORMANCE STATUS: 1 - Symptomatic but completely ambulatory  Vitals:   09/01/21 1521  BP: 139/61  Pulse: 71  Resp: 18  Temp: 97.9 F (36.6 C)  SpO2: 97%   Filed Weights   09/01/21 1521  Weight: 205 lb 9.6 oz (93.3 kg)    BREAST: No palpable masses or nodules in either right  or left breasts. No palpable axillary supraclavicular or infraclavicular adenopathy no breast tenderness or nipple discharge. (exam performed in the presence of a chaperone)  LABORATORY DATA:  I have reviewed the data as listed CMP Latest Ref Rng & Units 08/27/2018 08/28/2017 02/26/2017  Glucose 70 - 99 mg/dL 80 90 70  BUN 8 - 23 mg/dL 15 17.7  18.3  Creatinine 0.44 - 1.00 mg/dL 0.82 0.8 0.8  Sodium 135 - 145 mmol/L 142 142 144  Potassium 3.5 - 5.1 mmol/L 4.7 4.2 4.5  Chloride 98 - 111 mmol/L 106 - -  CO2 22 - 32 mmol/L 29 28 29   Calcium 8.9 - 10.3 mg/dL 9.9 9.9 9.7  Total Protein 6.5 - 8.1 g/dL 6.5 6.5 6.4  Total Bilirubin 0.3 - 1.2 mg/dL 0.9 1.04 1.18  Alkaline Phos 38 - 126 U/L 92 86 79  AST 15 - 41 U/L 19 18 19   ALT 0 - 44 U/L 20 20 21     Lab Results  Component Value Date   WBC 5.1 08/27/2018   HGB 14.1 08/27/2018   HCT 43.1 08/27/2018   MCV 90.4 08/27/2018   PLT 194 08/27/2018   NEUTROABS 3.4 08/27/2018    ASSESSMENT & PLAN:  Malignant neoplasm of upper-outer quadrant of right female breast ([place]) Right lumpectomy 05/26/2016: IDC with DCIS 1.6 cm, margins, 0/2 lymph nodes negative, ER 100%, PR 20%, Ki-67 15%, HER-2 negative ratio 1.0 T1 BN 0 stage IA pathologic stage Oncotype DX 26, 17% risk of recurrence   Treatment Summary: 1. Adjuvant chemotherapy with Taxotere and Cytoxan X 4 06/27/16 to 08/29/16 followed by 2. Adjuvant radiation therapy 10/05/16 to 11/02/16 3. Adjuvant antiestrogen therapy with Letrozole 2.5 mg daily Started 11/28/2016 switched to anastrozole 02/26/17 (hot flashes) ------------------------------------------------------------------------------------------------------------------------------------------ Anastrozole toxicities: 1. Occasional muscle aches 2. hot flashes have improved I renewed her prescription for anastrozole. Our plan is to treat her for 7 years   Surveillance: 1. Breast Exam:   09/01/2021: Benign 2. Mammogram 05/27/2021: No evidence of malignancy, breast density category B Bone density 09/22/2020: T score -1.5: Mild osteopenia (recommended calcium and vitamin D)   Weight issues: She has lost some weight by watching what she eats.   RTC in 1 year    No orders of the defined types were placed in this encounter.  The patient has a good understanding of the overall plan.  she agrees with it. she will call with any problems that may develop before the next visit here.  Total time spent: 20 mins including face to face time and time spent for planning, charting and coordination of care  Rulon Eisenmenger, MD, MPH 09/01/2021  I, Thana Ates, am acting as scribe for Dr. Nicholas Lose.  I have reviewed the above documentation for accuracy and completeness, and I agree with the above.

## 2021-09-01 ENCOUNTER — Inpatient Hospital Stay: Payer: PPO | Attending: Hematology and Oncology | Admitting: Hematology and Oncology

## 2021-09-01 ENCOUNTER — Other Ambulatory Visit: Payer: Self-pay

## 2021-09-01 DIAGNOSIS — N951 Menopausal and female climacteric states: Secondary | ICD-10-CM | POA: Diagnosis not present

## 2021-09-01 DIAGNOSIS — Z170 Estrogen receptor positive status [ER+]: Secondary | ICD-10-CM | POA: Diagnosis not present

## 2021-09-01 DIAGNOSIS — Z79899 Other long term (current) drug therapy: Secondary | ICD-10-CM | POA: Diagnosis not present

## 2021-09-01 DIAGNOSIS — Z79811 Long term (current) use of aromatase inhibitors: Secondary | ICD-10-CM | POA: Insufficient documentation

## 2021-09-01 DIAGNOSIS — C50411 Malignant neoplasm of upper-outer quadrant of right female breast: Secondary | ICD-10-CM | POA: Diagnosis not present

## 2021-09-01 MED ORDER — ANASTROZOLE 1 MG PO TABS
1.0000 mg | ORAL_TABLET | Freq: Every day | ORAL | 3 refills | Status: DC
Start: 2021-09-01 — End: 2022-09-05

## 2021-09-01 NOTE — Assessment & Plan Note (Signed)
Right lumpectomy 05/26/2016: IDC with DCIS 1.6 cm, margins, 0/2 lymph nodes negative, ER 100%, PR 20%, Ki-67 15%, HER-2 negative ratio 1.0 T1 BN 0 stage IA pathologic stage Oncotype DX 26, 17% risk of recurrence  Treatment Summary: 1. Adjuvant chemotherapy with Taxotere and Cytoxan X 4 06/27/16 to 9/12/17followed by 2. Adjuvant radiation therapy 10/05/16 to 11/02/16 3. Adjuvant antiestrogen therapy with Letrozole 2.5 mg daily Started 12/12/2017switched to anastrozole 02/26/17 (hot flashes) ------------------------------------------------------------------------------------------------------------------------------------------ Anastrozoletoxicities: 1.Occasional muscle aches 2.hot flashes have improved I renewed her prescription for anastrozole. Our plan is to treat her for 7 years  Surveillance: 1. Breast Exam:  09/01/2021: Benign 2. Mammogram6/09/2021:No evidence of malignancy, breast density categoryB Bone density 09/22/2020: T score -1.5: Mild osteopenia (recommended calcium and vitamin D)  Weight issues: Hypertension: She tells me that her blood pressure is excellent at home. RTC in 1 year

## 2021-09-29 DIAGNOSIS — H40013 Open angle with borderline findings, low risk, bilateral: Secondary | ICD-10-CM | POA: Diagnosis not present

## 2022-01-30 DIAGNOSIS — H35371 Puckering of macula, right eye: Secondary | ICD-10-CM | POA: Diagnosis not present

## 2022-01-30 DIAGNOSIS — H40013 Open angle with borderline findings, low risk, bilateral: Secondary | ICD-10-CM | POA: Diagnosis not present

## 2022-02-23 DIAGNOSIS — D235 Other benign neoplasm of skin of trunk: Secondary | ICD-10-CM | POA: Diagnosis not present

## 2022-02-23 DIAGNOSIS — L821 Other seborrheic keratosis: Secondary | ICD-10-CM | POA: Diagnosis not present

## 2022-02-23 DIAGNOSIS — D2261 Melanocytic nevi of right upper limb, including shoulder: Secondary | ICD-10-CM | POA: Diagnosis not present

## 2022-02-23 DIAGNOSIS — D224 Melanocytic nevi of scalp and neck: Secondary | ICD-10-CM | POA: Diagnosis not present

## 2022-02-23 DIAGNOSIS — C44319 Basal cell carcinoma of skin of other parts of face: Secondary | ICD-10-CM | POA: Diagnosis not present

## 2022-02-23 DIAGNOSIS — H61022 Chronic perichondritis of left external ear: Secondary | ICD-10-CM | POA: Diagnosis not present

## 2022-02-23 DIAGNOSIS — D1801 Hemangioma of skin and subcutaneous tissue: Secondary | ICD-10-CM | POA: Diagnosis not present

## 2022-02-23 DIAGNOSIS — D2262 Melanocytic nevi of left upper limb, including shoulder: Secondary | ICD-10-CM | POA: Diagnosis not present

## 2022-02-23 DIAGNOSIS — D225 Melanocytic nevi of trunk: Secondary | ICD-10-CM | POA: Diagnosis not present

## 2022-04-05 DIAGNOSIS — Z85828 Personal history of other malignant neoplasm of skin: Secondary | ICD-10-CM | POA: Diagnosis not present

## 2022-04-05 DIAGNOSIS — C44319 Basal cell carcinoma of skin of other parts of face: Secondary | ICD-10-CM | POA: Diagnosis not present

## 2022-04-10 ENCOUNTER — Other Ambulatory Visit: Payer: Self-pay | Admitting: Hematology and Oncology

## 2022-04-10 DIAGNOSIS — Z1231 Encounter for screening mammogram for malignant neoplasm of breast: Secondary | ICD-10-CM

## 2022-05-29 ENCOUNTER — Ambulatory Visit
Admission: RE | Admit: 2022-05-29 | Discharge: 2022-05-29 | Disposition: A | Discharge status: Home / Self Care | Payer: PPO | Source: Ambulatory Visit | Attending: Hematology and Oncology | Admitting: Hematology and Oncology

## 2022-05-29 DIAGNOSIS — Z1231 Encounter for screening mammogram for malignant neoplasm of breast: Secondary | ICD-10-CM

## 2022-05-31 ENCOUNTER — Other Ambulatory Visit: Payer: Self-pay | Admitting: Hematology and Oncology

## 2022-05-31 DIAGNOSIS — R928 Other abnormal and inconclusive findings on diagnostic imaging of breast: Secondary | ICD-10-CM

## 2022-06-05 ENCOUNTER — Ambulatory Visit
Admission: RE | Admit: 2022-06-05 | Discharge: 2022-06-05 | Disposition: A | Discharge status: Home / Self Care | Payer: PPO | Source: Ambulatory Visit | Attending: Hematology and Oncology | Admitting: Hematology and Oncology

## 2022-06-05 DIAGNOSIS — R921 Mammographic calcification found on diagnostic imaging of breast: Secondary | ICD-10-CM | POA: Diagnosis not present

## 2022-06-05 DIAGNOSIS — R928 Other abnormal and inconclusive findings on diagnostic imaging of breast: Secondary | ICD-10-CM

## 2022-06-05 DIAGNOSIS — Z853 Personal history of malignant neoplasm of breast: Secondary | ICD-10-CM | POA: Diagnosis not present

## 2022-06-07 ENCOUNTER — Other Ambulatory Visit: Payer: Self-pay | Admitting: Adult Health

## 2022-06-07 DIAGNOSIS — R921 Mammographic calcification found on diagnostic imaging of breast: Secondary | ICD-10-CM

## 2022-06-09 ENCOUNTER — Ambulatory Visit
Admission: RE | Admit: 2022-06-09 | Discharge: 2022-06-09 | Disposition: A | Discharge status: Home / Self Care | Payer: PPO | Source: Ambulatory Visit | Attending: Adult Health | Admitting: Adult Health

## 2022-06-09 ENCOUNTER — Other Ambulatory Visit: Payer: Self-pay | Admitting: Diagnostic Radiology

## 2022-06-09 DIAGNOSIS — R921 Mammographic calcification found on diagnostic imaging of breast: Secondary | ICD-10-CM

## 2022-06-09 DIAGNOSIS — N6032 Fibrosclerosis of left breast: Secondary | ICD-10-CM | POA: Diagnosis not present

## 2022-07-11 ENCOUNTER — Other Ambulatory Visit: Payer: Self-pay | Admitting: Family Medicine

## 2022-07-11 DIAGNOSIS — Z853 Personal history of malignant neoplasm of breast: Secondary | ICD-10-CM | POA: Diagnosis not present

## 2022-07-11 DIAGNOSIS — Z0000 Encounter for general adult medical examination without abnormal findings: Secondary | ICD-10-CM | POA: Diagnosis not present

## 2022-07-11 DIAGNOSIS — Z1389 Encounter for screening for other disorder: Secondary | ICD-10-CM | POA: Diagnosis not present

## 2022-07-11 DIAGNOSIS — E785 Hyperlipidemia, unspecified: Secondary | ICD-10-CM | POA: Diagnosis not present

## 2022-07-11 DIAGNOSIS — E049 Nontoxic goiter, unspecified: Secondary | ICD-10-CM

## 2022-07-11 DIAGNOSIS — M85859 Other specified disorders of bone density and structure, unspecified thigh: Secondary | ICD-10-CM | POA: Diagnosis not present

## 2022-07-11 DIAGNOSIS — Z6832 Body mass index (BMI) 32.0-32.9, adult: Secondary | ICD-10-CM | POA: Diagnosis not present

## 2022-07-11 DIAGNOSIS — E669 Obesity, unspecified: Secondary | ICD-10-CM | POA: Diagnosis not present

## 2022-07-11 DIAGNOSIS — Z23 Encounter for immunization: Secondary | ICD-10-CM | POA: Diagnosis not present

## 2022-07-12 ENCOUNTER — Ambulatory Visit
Admission: RE | Admit: 2022-07-12 | Discharge: 2022-07-12 | Disposition: A | Discharge status: Home / Self Care | Payer: PPO | Source: Ambulatory Visit | Attending: Family Medicine | Admitting: Family Medicine

## 2022-07-12 DIAGNOSIS — E041 Nontoxic single thyroid nodule: Secondary | ICD-10-CM | POA: Diagnosis not present

## 2022-07-12 DIAGNOSIS — E049 Nontoxic goiter, unspecified: Secondary | ICD-10-CM

## 2022-08-18 ENCOUNTER — Telehealth: Payer: Self-pay | Admitting: Hematology and Oncology

## 2022-08-18 NOTE — Telephone Encounter (Signed)
Rescheduled appointment per provider PAL. Patient is aware of the changes made to her upcoming appointment. 

## 2022-09-01 ENCOUNTER — Ambulatory Visit: Payer: PPO | Admitting: Hematology and Oncology

## 2022-09-05 ENCOUNTER — Inpatient Hospital Stay: Payer: PPO | Attending: Hematology and Oncology | Admitting: Hematology and Oncology

## 2022-09-05 ENCOUNTER — Other Ambulatory Visit: Payer: Self-pay

## 2022-09-05 DIAGNOSIS — N951 Menopausal and female climacteric states: Secondary | ICD-10-CM | POA: Insufficient documentation

## 2022-09-05 DIAGNOSIS — Z9221 Personal history of antineoplastic chemotherapy: Secondary | ICD-10-CM | POA: Diagnosis not present

## 2022-09-05 DIAGNOSIS — Z79811 Long term (current) use of aromatase inhibitors: Secondary | ICD-10-CM | POA: Diagnosis not present

## 2022-09-05 DIAGNOSIS — C50411 Malignant neoplasm of upper-outer quadrant of right female breast: Secondary | ICD-10-CM | POA: Diagnosis not present

## 2022-09-05 DIAGNOSIS — Z79899 Other long term (current) drug therapy: Secondary | ICD-10-CM | POA: Diagnosis not present

## 2022-09-05 DIAGNOSIS — Z170 Estrogen receptor positive status [ER+]: Secondary | ICD-10-CM | POA: Insufficient documentation

## 2022-09-05 MED ORDER — ANASTROZOLE 1 MG PO TABS: 1.0000 mg | ORAL_TABLET | Freq: Every day | ORAL | 3 refills | Status: DC

## 2022-09-05 NOTE — Assessment & Plan Note (Addendum)
Right lumpectomy 05/26/2016: IDC with DCIS 1.6 cm, margins, 0/2 lymph nodes negative, ER 100%, PR 20%, Ki-67 15%, HER-2 negative ratio 1.0 T1 BN 0 stage IA pathologic stage Oncotype DX 26, 17% risk of recurrence  Treatment Summary: 1. Adjuvant chemotherapy with Taxotere and Cytoxan X 4 06/27/16 to 9/12/17followed by 2. Adjuvant radiation therapy 10/05/16 to 11/02/16 3. Adjuvant antiestrogen therapy with Letrozole 2.5 mg daily Started 12/12/2017switched to anastrozole 02/26/17 (hot flashes) ------------------------------------------------------------------------------------------------------------------------------------------ Anastrozoletoxicities: 1.Occasional muscle aches 2.hot flashes have improved I renewed her prescription for anastrozole. Our plan is to treat her for 7 years  Surveillance: 1. Breast Exam:09/05/2022: Benign 2. Mammogram6/11/2022:Calcifications in the left breast, diagnostic mammogram 06/05/2022: 11.2 cm group of indeterminate calcifications in the anterior left breast, biopsy 06/09/2022: Benign (healed fat necrosis) Bone density 09/22/2020: T score -1.5: Mild osteopenia (recommended calcium and vitamin D)  Weight issues: She has lost some weight by watching what she eats.   RTC in 1 year

## 2022-09-05 NOTE — Progress Notes (Signed)
Patient Care Team: Kathyrn Lass, MD as PCP - General (Family Medicine) Nicholas Lose, MD as Consulting Physician (Hematology and Oncology) Kyung Rudd, MD as Consulting Physician (Radiation Oncology) Delice Bison Charlestine Massed, NP as Nurse Practitioner (Hematology and Oncology)  DIAGNOSIS:  Encounter Diagnosis  Name Primary?   Malignant neoplasm of upper-outer quadrant of right breast in female, estrogen receptor positive ([place])     SUMMARY OF ONCOLOGIC HISTORY: Oncology History  Malignant neoplasm of upper-outer quadrant of right female breast ([place])  04/05/2016 Mammogram   Asymmetry in the right breast lateral middle depth, 11 mm non-circumscribed mass ([facility] mammogram)   04/05/2016 Initial Diagnosis   Right breast biopsy: Invasive ductal carcinoma ER 90-100%, PR 20%, Ki-67 20%, HER-2 Neg, T1b N0 stage IA clinical stage   05/26/2016 Surgery   Right lumpectomy: IDC with DCIS 1.6, negative margins, 2 SLN negative, ER 100%, PR 20%, Ki-67 15%, HER-2 negative ratio 1.0 T1 BN 0 stage IA pathologic stage, Oncotype DX 26, 17% ROR   06/27/2016 - 08/29/2016 Chemotherapy   Adjuvant chemotherapy with Taxotere and Cytoxan 4   10/05/2016 - 11/02/2016 Radiation Therapy   Adjuvant XRT   11/28/2016 -  Anti-estrogen oral therapy   Letrozole 1 mg daily, changed to Anastrozole in March, 2018   06/09/2017 Genetic Testing   Genetic counseling and testing for hereditary cancer syndromes performed on 05/31/2017. Results are negative for pathogenic mutations in 83 genes analyzed by [facility]'s Multi-Cancers Panel. Results are dated 06/09/2017. Genes tested: ALK, APC, ATM, AXIN2, BAP1, BARD1, BLM, BMPR1A, BRCA1, BRCA2, BRIP1, CASR, CDC73, CDH1, CDK4, CDKN1B, CDKN1C, CDKN2A, CEBPA, CHEK2, CTNNA1, DICER1, DIS3L2, EGFR, EPCAM, FH, FLCN, GATA2, GPC3, GREM1, HOXB13, HRAS, KIT, MAX, MEN1, MET, MITF, MLH1, MSH2, MSH3, MSH6, MUTYH, NBN, NF1, NF2, NTHL1, PALB2, PDGFRA, PHOX2B, PMS2, POLD1, POLE, POT1, PRKAR1A, PTCH1,  PTEN, RAD50, RAD51C, RAD51D, RB1, RECQL4, RET, RUNX1, SDHA, SDHAF2, SDHB, SDHC, SDHD, SMAD4, SMARCA4, SMARCB1, SMARCE1, STK11, SUFU, TERC, TERT, TMEM127, TP53, TSC1, TSC2, VHL, WRN, WT1.       CHIEF COMPLIANT: Follow-up of right breast cancer on anastrozole therapy    INTERVAL HISTORY: Mercedes Nelson is a 71 y.o. with above-mentioned history of right breast cancer treated with lumpectomy, adjuvant chemotherapy, radiation, and who is currently on anastrozole. She presents to the clinic today for a follow-up. She states that she is tolerating the anastrozole. She does have mild hot flashes. Denies pain and discomfort in breast. She reports she has lost a few pounds. She does go to the park and walk. She reports she walked 3 miles on this past Friday.    ALLERGIES:  is allergic to no known allergies.  MEDICATIONS:  Current Outpatient Medications  Medication Sig Dispense Refill   Calcium Carbonate-Vitamin D (CALCIUM 500/D) 500-125 MG-UNIT TABS Take 1,200 mg by mouth 2 (two) times daily.     Multiple Vitamin (MULTIVITAMIN) tablet Take 1 tablet by mouth daily.     simvastatin (ZOCOR) 20 MG tablet Take 20 mg by mouth daily after supper.     anastrozole (ARIMIDEX) 1 MG tablet Take 1 tablet (1 mg total) by mouth daily. 90 tablet 3   No current facility-administered medications for this visit.    PHYSICAL EXAMINATION: ECOG PERFORMANCE STATUS: 1 - Symptomatic but completely ambulatory  Vitals:   09/05/22 0940  BP: (!) 141/65  Pulse: 66  Resp: 18  Temp: (!) 97.3 F (36.3 C)  SpO2: 98%   Filed Weights   09/05/22 0940  Weight: 199 lb 3.2 oz (90.4 kg)  BREAST: No palpable masses or nodules in either right or left breasts. No palpable axillary supraclavicular or infraclavicular adenopathy no breast tenderness or nipple discharge. (exam performed in the presence of a chaperone)  LABORATORY DATA:  I have reviewed the data as listed    Latest Ref Rng & Units 08/27/2018    8:41 AM  08/28/2017    9:11 AM 02/26/2017    8:16 AM  CMP  Glucose 70 - 99 mg/dL 80  90  70   BUN 8 - 23 mg/dL 15  17.7  18.3   Creatinine 0.44 - 1.00 mg/dL 0.82  0.8  0.8   Sodium 135 - 145 mmol/L 142  142  144   Potassium 3.5 - 5.1 mmol/L 4.7  4.2  4.5   Chloride 98 - 111 mmol/L 106     CO2 22 - 32 mmol/L _0 Calcium 8.9 - 10.3 mg/dL 9.9  9.9  9.7   Total Protein 6.5 - 8.1 g/dL 6.5  6.5  6.4   Total Bilirubin 0.3 - 1.2 mg/dL 0.9  1.04  1.18   Alkaline Phos 38 - 126 U/L 92  86  79   AST 15 - 41 U/L _1 ALT 0 - 44 U/L _2 Lab Results  Component Value Date   WBC 5.1 08/27/2018   HGB 14.1 08/27/2018   HCT 43.1 08/27/2018   MCV 90.4 08/27/2018   PLT 194 08/27/2018   NEUTROABS 3.4 08/27/2018    ASSESSMENT & PLAN:  Malignant neoplasm of upper-outer quadrant of right female breast ([place]) Right lumpectomy 05/26/2016: IDC with DCIS 1.6 cm, margins, 0/2 lymph nodes negative, ER 100%, PR 20%, Ki-67 15%, HER-2 negative ratio 1.0 T1 BN 0 stage IA pathologic stage Oncotype DX 26, 17% risk of recurrence   Treatment Summary: 1. Adjuvant chemotherapy with Taxotere and Cytoxan X 4 06/27/16 to 08/29/16 followed by 2. Adjuvant radiation therapy 10/05/16 to 11/02/16 3. Adjuvant antiestrogen therapy with Letrozole 2.5 mg daily Started 11/28/2016 switched to anastrozole 02/26/17 (hot flashes) ------------------------------------------------------------------------------------------------------------------------------------------ Anastrozole toxicities: 1. Occasional muscle aches 2. hot flashes have improved I renewed her prescription for anastrozole. Our plan is to treat her for 7 years   Surveillance: 1. Breast Exam: 09/05/2022: Benign 2. Mammogram 05/29/2022: Calcifications in the left breast, diagnostic mammogram 06/05/2022: 11.2 cm group of indeterminate calcifications in the anterior left breast, biopsy 06/09/2022: Benign (healed fat necrosis) Bone density 09/22/2020: T score  -1.5: Mild osteopenia (recommended calcium and vitamin D)   Weight issues: She has lost some weight by watching what she eats.   RTC in 1 year   No orders of the defined types were placed in this encounter.  The patient has a good understanding of the overall plan. she agrees with it. she will call with any problems that may develop before the next visit here. Total time spent: 30 mins including face to face time and time spent for planning, charting and co-ordination of care   Harriette Ohara, MD 09/05/22    I Gardiner Coins am scribing for Dr. Lindi Adie  I have reviewed the above documentation for accuracy and completeness, and I agree with the above.

## 2022-10-05 DIAGNOSIS — H40013 Open angle with borderline findings, low risk, bilateral: Secondary | ICD-10-CM | POA: Diagnosis not present

## 2023-02-05 DIAGNOSIS — Z83511 Family history of glaucoma: Secondary | ICD-10-CM | POA: Diagnosis not present

## 2023-02-05 DIAGNOSIS — H40013 Open angle with borderline findings, low risk, bilateral: Secondary | ICD-10-CM | POA: Diagnosis not present

## 2023-02-09 ENCOUNTER — Ambulatory Visit: Payer: PPO | Admitting: Podiatry

## 2023-02-09 VITALS — BP 140/82 | HR 76

## 2023-02-09 DIAGNOSIS — L989 Disorder of the skin and subcutaneous tissue, unspecified: Secondary | ICD-10-CM | POA: Diagnosis not present

## 2023-02-09 NOTE — Progress Notes (Signed)
   Chief Complaint  Patient presents with   Callouses    Plantar porokeratosis to the lateral borders of the feet near the 5th digits. Patient reports it is painful.    Subjective: Patient presents to the office today for follow up evaluation of painful callus lesions of the bilateral feet. She states that the pain has been ongoing for several years and is affecting her ability to ambulate without pain. Patient presents today for further treatment and evaluation.   Past Medical History:  Diagnosis Date   Breast cancer ([place]) 04/20/16   Right breast invasive ductal ca   Genetic testing 06/11/2017   Ms. Ruediger underwent genetic counseling and testing for hereditary cancer syndromes on 05/31/2017. Her results were negative for mutations in all 83 genes analyzed by [facility]'s 83-gene Multi-Cancers Panel. Genes analyzed: ALK, APC, ATM, AXIN2, BAP1, BARD1, BLM, BMPR1A, BRCA1, BRCA2, BRIP1, CASR, CDC73, CDH1, CDK4, CDKN1B, CDKN1C, CDKN2A, CEBPA, CHEK2, CTNNA1, DICER1, DIS3L2, EGFR, EPCAM, FH, FLCN, G   Goiter    Headache april 2017   "migraine and see starry thing" vision is affected, last 30 minutes   History of urinary tract infection    Personal history of chemotherapy    Personal history of radiation therapy    PONV (postoperative nausea and vomiting)    "worse headache of my life", headache 12 hours after procedure   Thyroid disease      Objective:  Physical Exam General: Alert and oriented x3 in no acute distress  Dermatology: Hyperkeratotic lesion present on the bilateral sub-fifth metatarsal. Pain on palpation with a central nucleated core noted.  Skin is warm, dry and supple bilateral lower extremities. Negative for open lesions or macerations.  Vascular: Palpable pedal pulses bilaterally. No edema or erythema noted. Capillary refill within normal limits.  Neurological: Epicritic and protective threshold grossly intact bilaterally.   Musculoskeletal Exam: Pain on palpation at the  keratotic lesion noted. Range of motion within normal limits bilateral. Muscle strength 5/5 in all groups bilateral.  Assessment: #1 porokeratosis bilateral sub-fifth metatarsal   Plan of Care:  #1 Patient evaluated #2 Excisional debridement of  keratoic lesion using a chisel blade was performed without incident.  #3 Treated area(s) with Salinocaine and dressed with light dressing. #4 Patient is to return to the clinic PRN.   Edrick Kins, DPM [facility]  Dr. Edrick Kins, [address]                                        [place], [place] [address]                Office [phone]  Fax [phone]

## 2023-02-22 DIAGNOSIS — D225 Melanocytic nevi of trunk: Secondary | ICD-10-CM | POA: Diagnosis not present

## 2023-02-22 DIAGNOSIS — Z85828 Personal history of other malignant neoplasm of skin: Secondary | ICD-10-CM | POA: Diagnosis not present

## 2023-02-22 DIAGNOSIS — D2261 Melanocytic nevi of right upper limb, including shoulder: Secondary | ICD-10-CM | POA: Diagnosis not present

## 2023-02-22 DIAGNOSIS — D1801 Hemangioma of skin and subcutaneous tissue: Secondary | ICD-10-CM | POA: Diagnosis not present

## 2023-02-22 DIAGNOSIS — L570 Actinic keratosis: Secondary | ICD-10-CM | POA: Diagnosis not present

## 2023-02-22 DIAGNOSIS — L821 Other seborrheic keratosis: Secondary | ICD-10-CM | POA: Diagnosis not present

## 2023-02-22 DIAGNOSIS — D2262 Melanocytic nevi of left upper limb, including shoulder: Secondary | ICD-10-CM | POA: Diagnosis not present

## 2023-02-22 DIAGNOSIS — D2239 Melanocytic nevi of other parts of face: Secondary | ICD-10-CM | POA: Diagnosis not present

## 2023-02-22 DIAGNOSIS — D2361 Other benign neoplasm of skin of right upper limb, including shoulder: Secondary | ICD-10-CM | POA: Diagnosis not present

## 2023-03-22 ENCOUNTER — Other Ambulatory Visit: Payer: Self-pay | Admitting: Hematology and Oncology

## 2023-03-22 DIAGNOSIS — Z1231 Encounter for screening mammogram for malignant neoplasm of breast: Secondary | ICD-10-CM

## 2023-03-22 IMAGING — MG MM DIGITAL SCREENING BILAT W/ TOMO AND CAD
6 of 12 series · 6 of 36 positions shown · non-contrast
Comparison: Previous exam(s).

CLINICAL DATA: Screening.

EXAM:
DIGITAL SCREENING BILATERAL MAMMOGRAM WITH TOMOSYNTHESIS AND CAD
TECHNIQUE: Bilateral screening digital craniocaudal and mediolateral oblique
mammograms were obtained. Bilateral screening digital breast
tomosynthesis was performed. The images were evaluated with
computer-aided detection.

[L MLO synth-2D (1 of 2)]
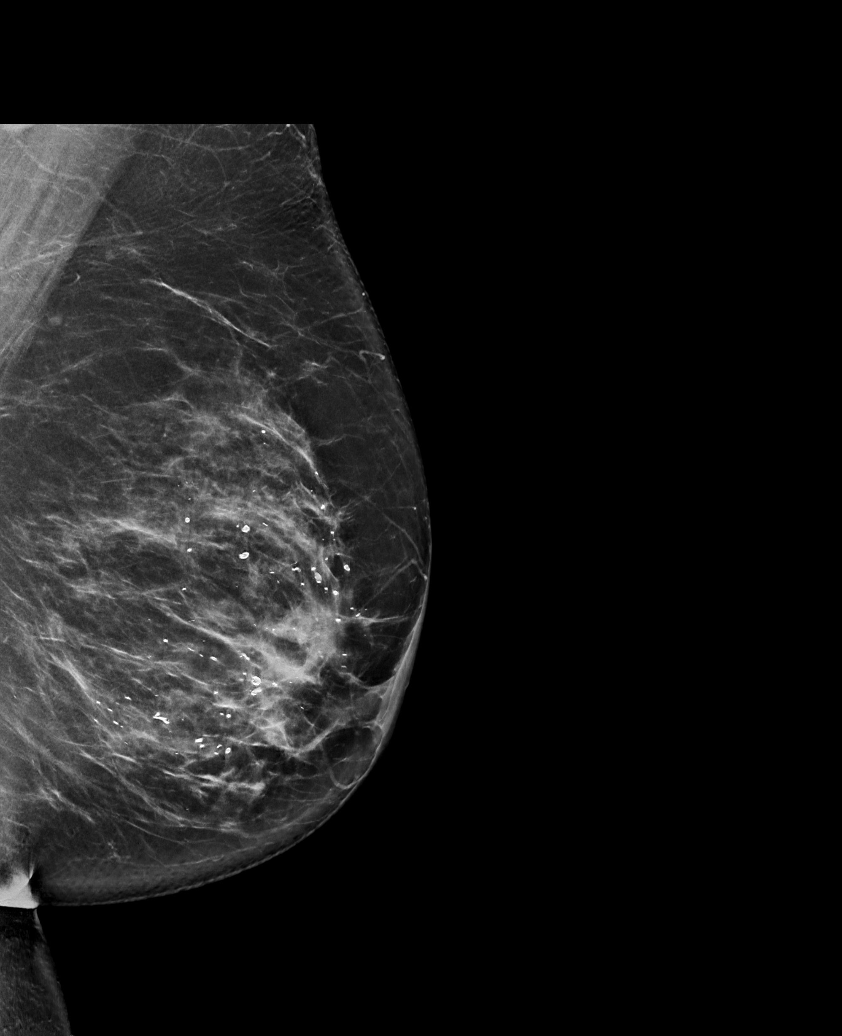

[L MLO synth-2D (2 of 2)]
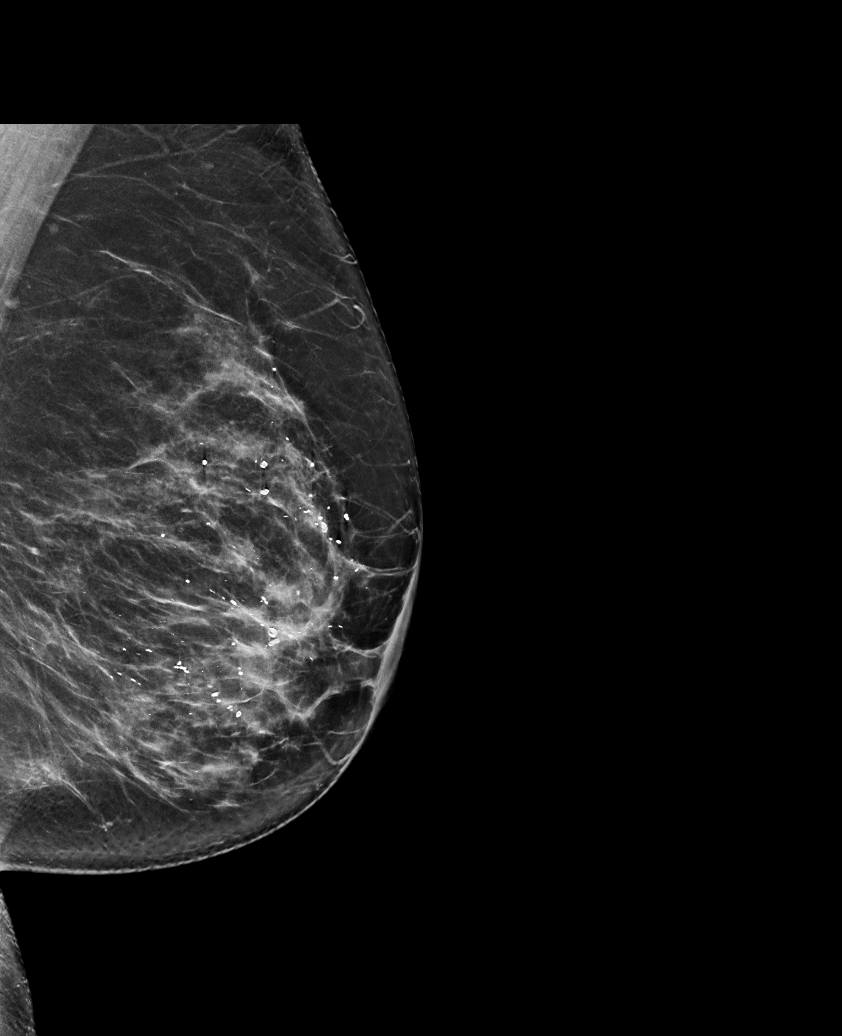

[R CC synth-2D (1 of 2)]
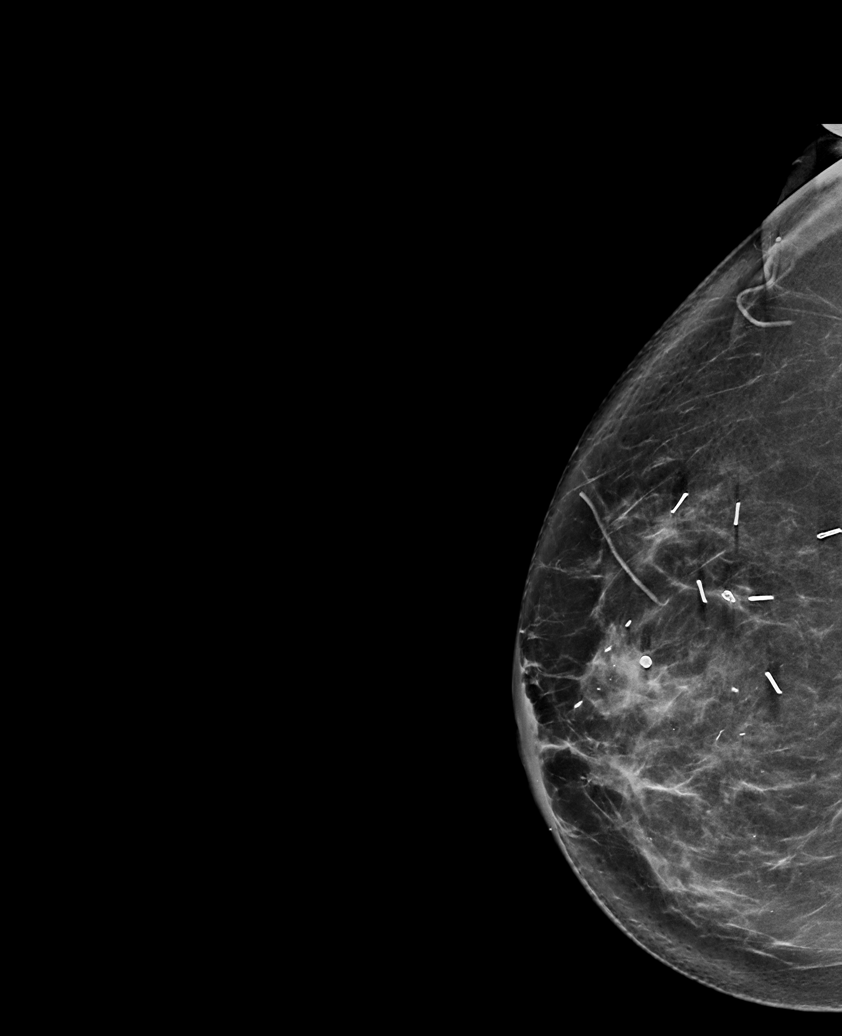

[R MLO synth-2D]
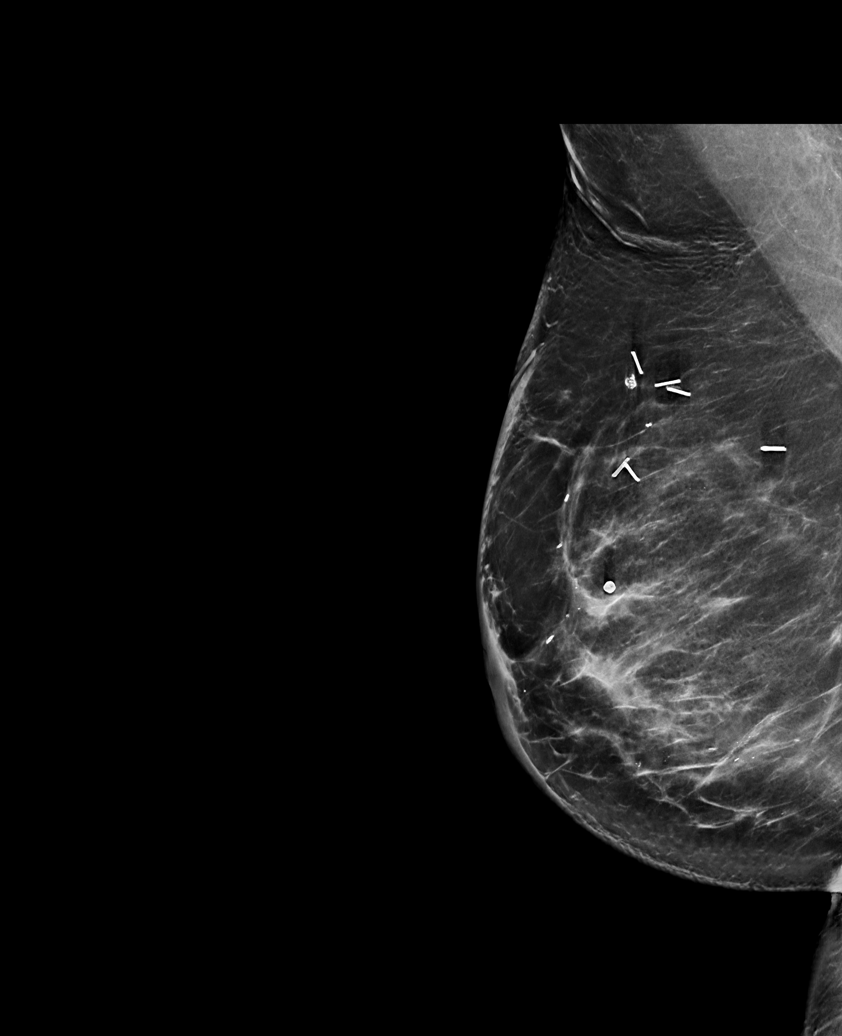

[L CC synth-2D]
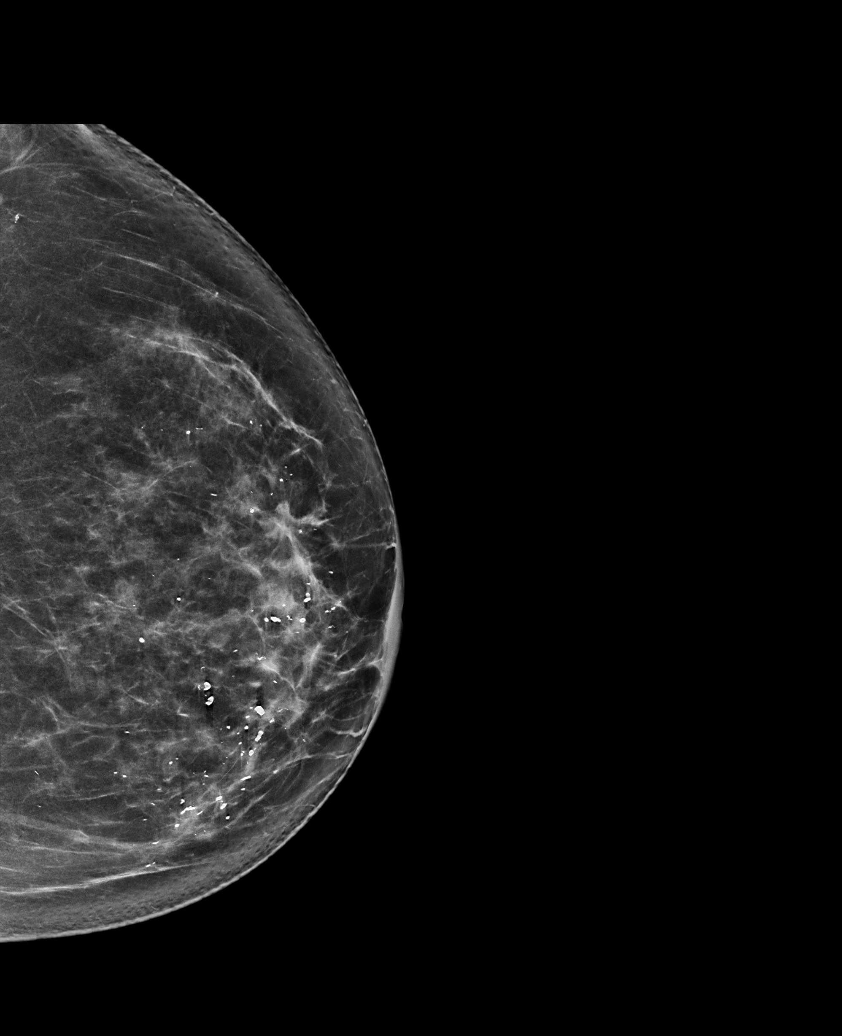

[R CC synth-2D (2 of 2)]
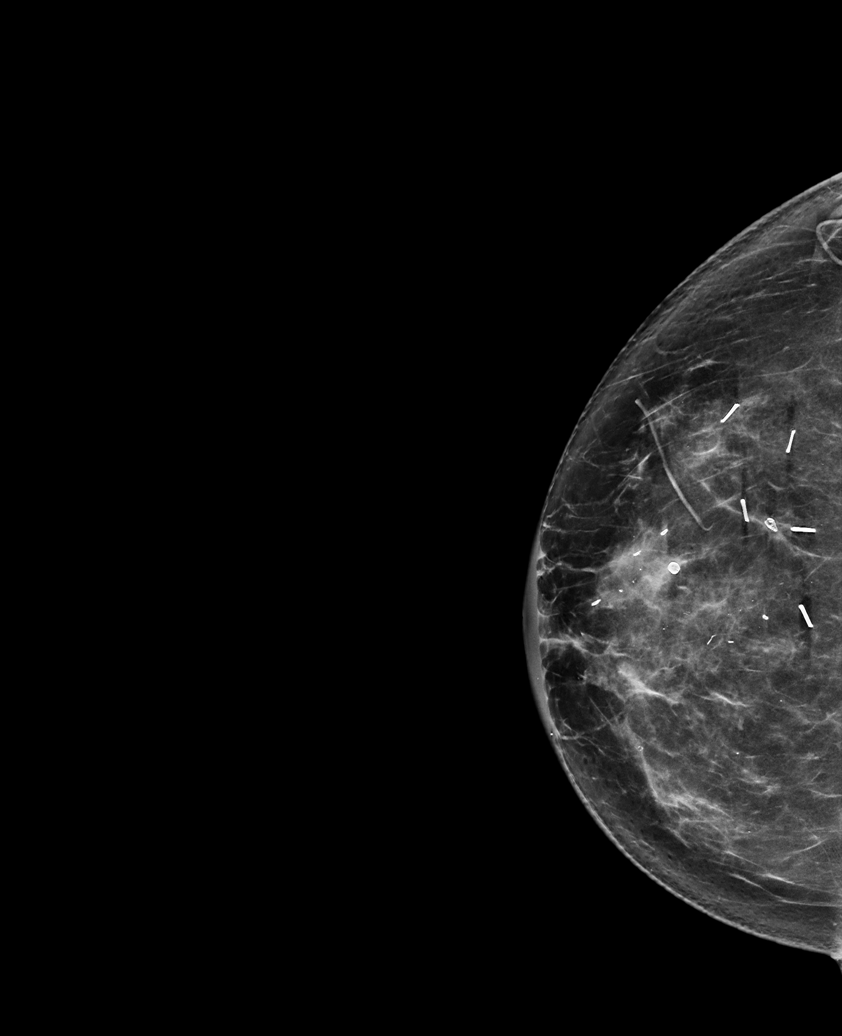

[6 of 36 positions shown; findings below may reference images not displayed]

ACR Breast Density Category c: The breast tissue is heterogeneously
dense, which may obscure small masses.
FINDINGS: There are no findings suspicious for malignancy. The images were
evaluated with computer-aided detection.
IMPRESSION: No mammographic evidence of malignancy. A result letter of this
screening mammogram will be mailed directly to the patient.

RECOMMENDATION:
Screening mammogram in one year. (Code:T4-5-GWO)

BI-RADS CATEGORY  1: Negative.

## 2023-06-11 ENCOUNTER — Ambulatory Visit
Admission: RE | Admit: 2023-06-11 | Discharge: 2023-06-11 | Disposition: A | Discharge status: Home / Self Care | Payer: PPO | Source: Ambulatory Visit | Attending: Hematology and Oncology | Admitting: Hematology and Oncology

## 2023-06-11 DIAGNOSIS — Z1231 Encounter for screening mammogram for malignant neoplasm of breast: Secondary | ICD-10-CM

## 2023-07-13 DIAGNOSIS — Z6832 Body mass index (BMI) 32.0-32.9, adult: Secondary | ICD-10-CM | POA: Diagnosis not present

## 2023-07-13 DIAGNOSIS — R002 Palpitations: Secondary | ICD-10-CM | POA: Diagnosis not present

## 2023-07-13 DIAGNOSIS — Z9181 History of falling: Secondary | ICD-10-CM | POA: Diagnosis not present

## 2023-07-13 DIAGNOSIS — Z0000 Encounter for general adult medical examination without abnormal findings: Secondary | ICD-10-CM | POA: Diagnosis not present

## 2023-07-13 DIAGNOSIS — E785 Hyperlipidemia, unspecified: Secondary | ICD-10-CM | POA: Diagnosis not present

## 2023-07-13 DIAGNOSIS — E049 Nontoxic goiter, unspecified: Secondary | ICD-10-CM | POA: Diagnosis not present

## 2023-07-13 DIAGNOSIS — E669 Obesity, unspecified: Secondary | ICD-10-CM | POA: Diagnosis not present

## 2023-07-13 DIAGNOSIS — M85859 Other specified disorders of bone density and structure, unspecified thigh: Secondary | ICD-10-CM | POA: Diagnosis not present

## 2023-07-13 DIAGNOSIS — Z1389 Encounter for screening for other disorder: Secondary | ICD-10-CM | POA: Diagnosis not present

## 2023-07-13 DIAGNOSIS — C50911 Malignant neoplasm of unspecified site of right female breast: Secondary | ICD-10-CM | POA: Diagnosis not present

## 2023-07-16 ENCOUNTER — Other Ambulatory Visit: Payer: Self-pay | Admitting: Family Medicine

## 2023-07-16 DIAGNOSIS — E049 Nontoxic goiter, unspecified: Secondary | ICD-10-CM

## 2023-07-23 ENCOUNTER — Ambulatory Visit
Admission: RE | Admit: 2023-07-23 | Discharge: 2023-07-23 | Disposition: A | Discharge status: Home / Self Care | Payer: PPO | Source: Ambulatory Visit | Attending: Family Medicine | Admitting: Family Medicine

## 2023-07-23 DIAGNOSIS — E049 Nontoxic goiter, unspecified: Secondary | ICD-10-CM

## 2023-07-23 DIAGNOSIS — E042 Nontoxic multinodular goiter: Secondary | ICD-10-CM | POA: Diagnosis not present

## 2023-09-04 ENCOUNTER — Inpatient Hospital Stay: Payer: PPO | Attending: Hematology and Oncology | Admitting: Hematology and Oncology

## 2023-09-04 ENCOUNTER — Telehealth: Payer: Self-pay

## 2023-09-04 VITALS — BP 150/54 | HR 70 | Temp 97.8°F | Resp 18 | Ht 67.0 in | Wt 190.6 lb

## 2023-09-04 DIAGNOSIS — Z9221 Personal history of antineoplastic chemotherapy: Secondary | ICD-10-CM | POA: Insufficient documentation

## 2023-09-04 DIAGNOSIS — Z79811 Long term (current) use of aromatase inhibitors: Secondary | ICD-10-CM | POA: Diagnosis not present

## 2023-09-04 DIAGNOSIS — Z79899 Other long term (current) drug therapy: Secondary | ICD-10-CM | POA: Diagnosis not present

## 2023-09-04 DIAGNOSIS — Z170 Estrogen receptor positive status [ER+]: Secondary | ICD-10-CM | POA: Diagnosis not present

## 2023-09-04 DIAGNOSIS — Z923 Personal history of irradiation: Secondary | ICD-10-CM | POA: Insufficient documentation

## 2023-09-04 DIAGNOSIS — Z780 Asymptomatic menopausal state: Secondary | ICD-10-CM | POA: Diagnosis not present

## 2023-09-04 DIAGNOSIS — C50411 Malignant neoplasm of upper-outer quadrant of right female breast: Secondary | ICD-10-CM | POA: Insufficient documentation

## 2023-09-04 DIAGNOSIS — M8580 Other specified disorders of bone density and structure, unspecified site: Secondary | ICD-10-CM | POA: Diagnosis not present

## 2023-09-04 NOTE — Telephone Encounter (Signed)
Per md orders entered for signatera. Requisition and all supported documents faxed to [phone]. Faxed confirmation was received.

## 2023-09-04 NOTE — Progress Notes (Signed)
Patient Care Team: Sigmund Hazel, MD as PCP - General (Family Medicine) Serena Croissant, MD as Consulting Physician (Hematology and Oncology) Dorothy Puffer, MD as Consulting Physician (Radiation Oncology) Axel Filler Larna Daughters, NP as Nurse Practitioner (Hematology and Oncology)  DIAGNOSIS:  Encounter Diagnoses  Name Primary?   Malignant neoplasm of upper-outer quadrant of right breast in female, estrogen receptor positive (HCC) Yes   Post-menopausal     SUMMARY OF ONCOLOGIC HISTORY: Oncology History  Malignant neoplasm of upper-outer quadrant of right female breast (HCC)  04/05/2016 Mammogram   Asymmetry in the right breast lateral middle depth, 11 mm non-circumscribed mass ([facility] mammogram)   04/05/2016 Initial Diagnosis   Right breast biopsy: Invasive ductal carcinoma ER 90-100%, PR 20%, Ki-67 20%, HER-2 Neg, T1b N0 stage IA clinical stage   05/26/2016 Surgery   Right lumpectomy: IDC with DCIS 1.6, negative margins, 2 SLN negative, ER 100%, PR 20%, Ki-67 15%, HER-2 negative ratio 1.0 T1 BN 0 stage IA pathologic stage, Oncotype DX 26, 17% ROR   06/27/2016 - 08/29/2016 Chemotherapy   Adjuvant chemotherapy with Taxotere and Cytoxan 4   10/05/2016 - 11/02/2016 Radiation Therapy   Adjuvant XRT   11/28/2016 -  Anti-estrogen oral therapy   Letrozole 1 mg daily, changed to Anastrozole in March, 2018   06/09/2017 Genetic Testing   Genetic counseling and testing for hereditary cancer syndromes performed on 05/31/2017. Results are negative for pathogenic mutations in 83 genes analyzed by [facility]'s Multi-Cancers Panel. Results are dated 06/09/2017. Genes tested: ALK, APC, ATM, AXIN2, BAP1, BARD1, BLM, BMPR1A, BRCA1, BRCA2, BRIP1, CASR, CDC73, CDH1, CDK4, CDKN1B, CDKN1C, CDKN2A, CEBPA, CHEK2, CTNNA1, DICER1, DIS3L2, EGFR, EPCAM, FH, FLCN, GATA2, GPC3, GREM1, HOXB13, HRAS, KIT, MAX, MEN1, MET, MITF, MLH1, MSH2, MSH3, MSH6, MUTYH, NBN, NF1, NF2, NTHL1, PALB2, PDGFRA, PHOX2B, PMS2, POLD1, POLE,  POT1, PRKAR1A, PTCH1, PTEN, RAD50, RAD51C, RAD51D, RB1, RECQL4, RET, RUNX1, SDHA, SDHAF2, SDHB, SDHC, SDHD, SMAD4, SMARCA4, SMARCB1, SMARCE1, STK11, SUFU, TERC, TERT, TMEM127, TP53, TSC1, TSC2, VHL, WRN, WT1.       CHIEF COMPLIANT:   Discussed the use of AI scribe software for clinical note transcription with the patient, who gave verbal consent to proceed.  History of Present Illness   The patient, a breast cancer survivor, is nearing the end of her seven-year treatment plan. She was diagnosed in 2017, underwent a lumpectomy in June, chemotherapy until September, and radiation until November of the same year. She started her current medication in December 2017. She reports feeling good health-wise and has been compliant with her medication. She is due to finish her medication in about six to eight weeks. She has been experiencing side effects such as aches, low energy, hot flushes, and insomnia, which she hopes will decrease after stopping the medication. She has been getting regular mammograms in June, which have been coming out good. Her last bone density test was in 2021. She also has a history of thyroid issues, which her primary care physician is monitoring. She has a family history of ovarian cancer but genetic testing did not reveal any genetic predisposition.         ALLERGIES:  is allergic to no known allergies.  MEDICATIONS:  Current Outpatient Medications  Medication Sig Dispense Refill   anastrozole (ARIMIDEX) 1 MG tablet Take 1 tablet (1 mg total) by mouth daily. 90 tablet 3   Calcium Carbonate-Vitamin D (CALCIUM 500/D) 500-125 MG-UNIT TABS Take 1,200 mg by mouth 2 (two) times daily.     Multiple Vitamin (MULTIVITAMIN) tablet Take  1 tablet by mouth daily.     simvastatin (ZOCOR) 20 MG tablet Take 20 mg by mouth daily after supper.     No current facility-administered medications for this visit.    PHYSICAL EXAMINATION: ECOG PERFORMANCE STATUS: 1 - Symptomatic but  completely ambulatory  Vitals:   09/04/23 1005  BP: (!) 150/54  Pulse: 70  Resp: 18  Temp: 97.8 F (36.6 C)  SpO2: 97%   Filed Weights   09/04/23 1005  Weight: 190 lb 9.6 oz (86.5 kg)    Physical Exam          (exam performed in the presence of a chaperone)  LABORATORY DATA:  I have reviewed the data as listed    Latest Ref Rng & Units 08/27/2018    8:41 AM 08/28/2017    9:11 AM 02/26/2017    8:16 AM  CMP  Glucose 70 - 99 mg/dL 80  90  70   BUN 8 - 23 mg/dL 15  86.5  78.4   Creatinine 0.44 - 1.00 mg/dL 6.96  0.8  0.8   Sodium 135 - 145 mmol/L 142  142  144   Potassium 3.5 - 5.1 mmol/L 4.7  4.2  4.5   Chloride 98 - 111 mmol/L 106     CO2 22 - 32 mmol/L 29  28  29    Calcium 8.9 - 10.3 mg/dL 9.9  9.9  9.7   Total Protein 6.5 - 8.1 g/dL 6.5  6.5  6.4   Total Bilirubin 0.3 - 1.2 mg/dL 0.9  2.95  2.84   Alkaline Phos 38 - 126 U/L 92  86  79   AST 15 - 41 U/L 19  18  19    ALT 0 - 44 U/L 20  20  21      Lab Results  Component Value Date   WBC 5.1 08/27/2018   HGB 14.1 08/27/2018   HCT 43.1 08/27/2018   MCV 90.4 08/27/2018   PLT 194 08/27/2018   NEUTROABS 3.4 08/27/2018    ASSESSMENT & PLAN:  Malignant neoplasm of upper-outer quadrant of right female breast (HCC) Right lumpectomy 05/26/2016: IDC with DCIS 1.6 cm, margins, 0/2 lymph nodes negative, ER 100%, PR 20%, Ki-67 15%, HER-2 negative ratio 1.0 T1 BN 0 stage IA pathologic stage Oncotype DX 26, 17% risk of recurrence   Treatment Summary: 1. Adjuvant chemotherapy with Taxotere and Cytoxan X 4 06/27/16 to 08/29/16 followed by 2. Adjuvant radiation therapy 10/05/16 to 11/02/16 3. Adjuvant antiestrogen therapy with Letrozole 2.5 mg daily Started 11/28/2016 switched to anastrozole 02/26/17 (hot flashes) ------------------------------------------------------------------------------------------------------------------------------------------ Anastrozole toxicities: 1. Occasional muscle aches 2. hot flashes have  improved I renewed her prescription for anastrozole. Our plan is to treat her for 7 years   Surveillance: 1. Breast Exam: 09/04/2023: Benign 2. Mammogram 06/13/2023: Benign breast density category C Bone density 09/22/2020: T score -1.5: Mild osteopenia (recommended calcium and vitamin D)   Weight issues: She has lost some weight by watching what she eats.   RTC in 1 year ------------------------------------- Assessment and Plan    Breast Cancer Completion of 7 years of adjuvant therapy with endocrine therapy. No current symptoms suggestive of recurrence. -Discontinue endocrine therapy after current supply runs out in approximately 6-8 weeks. -Continue annual mammograms.  Bone Health Last bone density scan in 2021, no recent follow-up. -Order bone density scan at [facility] in the next 1-2 months.  Cancer Surveillance Discussed the use of Signatera, a blood test to detect early recurrence of  breast cancer. -Initiate Signatera testing, with home blood draws.  Family History of Ovarian Cancer No genetic predisposition identified on previous testing. No current gynecological follow-up. -Recommend re-establishing care with a gynecologist for periodic checks, given family history of ovarian cancer.  Follow-up Continue surveillance follow-ups with Mardella Layman our nurse practitioner for long term survivorship         Orders Placed This Encounter  Procedures   DG Bone Density    Standing Status:   Future    Standing Expiration Date:   09/03/2024    Order Specific Question:   Reason for Exam (SYMPTOM  OR DIAGNOSIS REQUIRED)    Answer:   post menopausal    Order Specific Question:   Preferred imaging location?    Answer:   [facility]    Order Specific Question:   Release to patient    Answer:   Immediate   The patient has a good understanding of the overall plan. she agrees with it. she will call with any problems that may develop before the next visit  here. Total time spent: 30 mins including face to face time and time spent for planning, charting and co-ordination of care   Tamsen Meek, MD 09/04/23

## 2023-09-04 NOTE — Assessment & Plan Note (Signed)
Right lumpectomy 05/26/2016: IDC with DCIS 1.6 cm, margins, 0/2 lymph nodes negative, ER 100%, PR 20%, Ki-67 15%, HER-2 negative ratio 1.0 T1 BN 0 stage IA pathologic stage Oncotype DX 26, 17% risk of recurrence   Treatment Summary: 1. Adjuvant chemotherapy with Taxotere and Cytoxan X 4 06/27/16 to 08/29/16 followed by 2. Adjuvant radiation therapy 10/05/16 to 11/02/16 3. Adjuvant antiestrogen therapy with Letrozole 2.5 mg daily Started 11/28/2016 switched to anastrozole 02/26/17 (hot flashes) ------------------------------------------------------------------------------------------------------------------------------------------ Anastrozole toxicities: 1. Occasional muscle aches 2. hot flashes have improved I renewed her prescription for anastrozole. Our plan is to treat her for 7 years   Surveillance: 1. Breast Exam: 09/04/2023: Benign 2. Mammogram 06/13/2023: Benign breast density category C Bone density 09/22/2020: T score -1.5: Mild osteopenia (recommended calcium and vitamin D)   Weight issues: She has lost some weight by watching what she eats.   RTC in 1 year

## 2023-09-04 NOTE — Addendum Note (Signed)
Addended by: Janan Ridge L on: 09/04/2023 11:42 AM   Modules accepted: Orders

## 2023-09-08 DIAGNOSIS — Z780 Asymptomatic menopausal state: Secondary | ICD-10-CM | POA: Diagnosis not present

## 2023-09-08 DIAGNOSIS — C50411 Malignant neoplasm of upper-outer quadrant of right female breast: Secondary | ICD-10-CM | POA: Diagnosis not present

## 2023-09-08 DIAGNOSIS — Z170 Estrogen receptor positive status [ER+]: Secondary | ICD-10-CM | POA: Diagnosis not present

## 2023-09-26 LAB — SIGNATERA ONLY (NATERA MANAGED)
SIGNATERA MTM READOUT: 0 MTM/ml
SIGNATERA TEST RESULT: NEGATIVE

## 2023-10-04 ENCOUNTER — Telehealth: Payer: Self-pay | Admitting: *Deleted

## 2023-10-04 ENCOUNTER — Ambulatory Visit (HOSPITAL_BASED_OUTPATIENT_CLINIC_OR_DEPARTMENT_OTHER)
Admission: RE | Admit: 2023-10-04 | Discharge: 2023-10-04 | Disposition: A | Discharge status: Home / Self Care | Payer: PPO | Source: Ambulatory Visit | Attending: Hematology and Oncology | Admitting: Hematology and Oncology

## 2023-10-04 DIAGNOSIS — M85852 Other specified disorders of bone density and structure, left thigh: Secondary | ICD-10-CM | POA: Diagnosis not present

## 2023-10-04 DIAGNOSIS — Z780 Asymptomatic menopausal state: Secondary | ICD-10-CM | POA: Insufficient documentation

## 2023-10-04 NOTE — Telephone Encounter (Signed)
-----   Message from Noreene Filbert sent at 10/04/2023  1:18 PM EDT ----- Bone density shows mild osteopenia.  Please let patient know.  Recommend calcium vitamin D weightbearing exercises and to repeat bone density testing in 2 years time. ----- Message ----- From: Interface, Rad Results In Sent: 10/04/2023  11:32 AM EDT To: Serena Croissant, MD

## 2023-10-04 NOTE — Telephone Encounter (Signed)
RN placed call to pt with below information. Pt educated and verbalized understanding.

## 2023-10-08 DIAGNOSIS — H40013 Open angle with borderline findings, low risk, bilateral: Secondary | ICD-10-CM | POA: Diagnosis not present

## 2023-10-26 ENCOUNTER — Telehealth: Payer: Self-pay

## 2023-10-26 NOTE — Telephone Encounter (Signed)
Called pt per MD to advise Signatera testing was negative/not detected. Pt verbalized understanding of results and knows [facility] will be in touch to schedule 3-6 mo repeat lab.

## 2024-02-21 DIAGNOSIS — Z780 Asymptomatic menopausal state: Secondary | ICD-10-CM | POA: Diagnosis not present

## 2024-02-21 DIAGNOSIS — C50411 Malignant neoplasm of upper-outer quadrant of right female breast: Secondary | ICD-10-CM | POA: Diagnosis not present

## 2024-02-21 DIAGNOSIS — Z170 Estrogen receptor positive status [ER+]: Secondary | ICD-10-CM | POA: Diagnosis not present

## 2024-02-25 DIAGNOSIS — D225 Melanocytic nevi of trunk: Secondary | ICD-10-CM | POA: Diagnosis not present

## 2024-02-25 DIAGNOSIS — L570 Actinic keratosis: Secondary | ICD-10-CM | POA: Diagnosis not present

## 2024-02-25 DIAGNOSIS — L820 Inflamed seborrheic keratosis: Secondary | ICD-10-CM | POA: Diagnosis not present

## 2024-02-25 DIAGNOSIS — L821 Other seborrheic keratosis: Secondary | ICD-10-CM | POA: Diagnosis not present

## 2024-02-25 DIAGNOSIS — D2262 Melanocytic nevi of left upper limb, including shoulder: Secondary | ICD-10-CM | POA: Diagnosis not present

## 2024-02-25 DIAGNOSIS — D485 Neoplasm of uncertain behavior of skin: Secondary | ICD-10-CM | POA: Diagnosis not present

## 2024-02-25 DIAGNOSIS — Z85828 Personal history of other malignant neoplasm of skin: Secondary | ICD-10-CM | POA: Diagnosis not present

## 2024-02-25 DIAGNOSIS — D2261 Melanocytic nevi of right upper limb, including shoulder: Secondary | ICD-10-CM | POA: Diagnosis not present

## 2024-02-25 DIAGNOSIS — D224 Melanocytic nevi of scalp and neck: Secondary | ICD-10-CM | POA: Diagnosis not present

## 2024-02-25 DIAGNOSIS — D1801 Hemangioma of skin and subcutaneous tissue: Secondary | ICD-10-CM | POA: Diagnosis not present

## 2024-02-25 DIAGNOSIS — D2361 Other benign neoplasm of skin of right upper limb, including shoulder: Secondary | ICD-10-CM | POA: Diagnosis not present

## 2024-02-28 LAB — SIGNATERA
SIGNATERA MTM READOUT: 0 MTM/ml
SIGNATERA TEST RESULT: NEGATIVE

## 2024-02-29 ENCOUNTER — Telehealth: Payer: Self-pay

## 2024-02-29 NOTE — Telephone Encounter (Signed)
 Called pt per MD to advise Signatera testing was negative/not detected. Pt verbalized understanding of results and knows [facility] will be in touch to schedule 6 mo repeat lab.

## 2024-03-05 ENCOUNTER — Ambulatory Visit: Admitting: Podiatry

## 2024-03-05 ENCOUNTER — Encounter: Payer: Self-pay | Admitting: Podiatry

## 2024-03-05 VITALS — Ht 67.0 in | Wt 190.6 lb

## 2024-03-05 DIAGNOSIS — D2372 Other benign neoplasm of skin of left lower limb, including hip: Secondary | ICD-10-CM | POA: Diagnosis not present

## 2024-03-05 NOTE — Progress Notes (Unsigned)
   Chief Complaint  Patient presents with   Callouses    corn removal bilateral feet.    Subjective: Patient presents to the office today for follow up evaluation of painful callus lesions of the bilateral feet. She states that the pain has been ongoing for several years and is affecting her ability to ambulate without pain. Patient presents today for further treatment and evaluation.   Past Medical History:  Diagnosis Date   Breast cancer (HCC) 04/20/16   Right breast invasive ductal ca   Genetic testing 06/11/2017   Ms. Chou underwent genetic counseling and testing for hereditary cancer syndromes on 05/31/2017. Her results were negative for mutations in all 83 genes analyzed by [facility]'s 83-gene Multi-Cancers Panel. Genes analyzed: ALK, APC, ATM, AXIN2, BAP1, BARD1, BLM, BMPR1A, BRCA1, BRCA2, BRIP1, CASR, CDC73, CDH1, CDK4, CDKN1B, CDKN1C, CDKN2A, CEBPA, CHEK2, CTNNA1, DICER1, DIS3L2, EGFR, EPCAM, FH, FLCN, G   Goiter    Headache april 2017   "migraine and see starry thing" vision is affected, last 30 minutes   History of urinary tract infection    Personal history of chemotherapy    Personal history of radiation therapy    PONV (postoperative nausea and vomiting)    "worse headache of my life", headache 12 hours after procedure   Thyroid disease      Objective:  Physical Exam General: Alert and oriented x3 in no acute distress  Dermatology: Hyperkeratotic lesion present on the bilateral sub-fifth metatarsal. Pain on palpation with a central nucleated core noted.  Skin is warm, dry and supple bilateral lower extremities. Negative for open lesions or macerations.  Vascular: Palpable pedal pulses bilaterally. No edema or erythema noted. Capillary refill within normal limits.  Neurological: Epicritic and protective threshold grossly intact bilaterally.   Musculoskeletal Exam: Pain on palpation at the keratotic lesion noted. Range of motion within normal limits bilateral. Muscle  strength 5/5 in all groups bilateral.  Assessment: #1 porokeratosis bilateral sub-fifth metatarsal   Plan of Care:  #1 Patient evaluated #2 Excisional debridement of  keratoic lesion using a chisel blade was performed without incident.  #3 Treated area(s) with Salinocaine and dressed with light dressing. #4 Patient is to return to the clinic PRN.   Felecia Shelling, DPM [facility]  Dr. Felecia Shelling, DPM    [address]                                        [place], [place] [address]                Office [phone]  Fax [phone]

## 2024-03-19 ENCOUNTER — Ambulatory Visit: Admitting: Podiatry

## 2024-03-31 ENCOUNTER — Other Ambulatory Visit: Payer: Self-pay | Admitting: Hematology and Oncology

## 2024-03-31 DIAGNOSIS — Z1231 Encounter for screening mammogram for malignant neoplasm of breast: Secondary | ICD-10-CM

## 2024-06-12 ENCOUNTER — Ambulatory Visit
Admission: RE | Admit: 2024-06-12 | Discharge: 2024-06-12 | Disposition: A | Discharge status: Home / Self Care | Source: Ambulatory Visit | Attending: Hematology and Oncology | Admitting: Hematology and Oncology

## 2024-06-12 DIAGNOSIS — Z1231 Encounter for screening mammogram for malignant neoplasm of breast: Secondary | ICD-10-CM

## 2024-07-15 DIAGNOSIS — Z1331 Encounter for screening for depression: Secondary | ICD-10-CM | POA: Diagnosis not present

## 2024-07-15 DIAGNOSIS — E049 Nontoxic goiter, unspecified: Secondary | ICD-10-CM | POA: Diagnosis not present

## 2024-07-15 DIAGNOSIS — E669 Obesity, unspecified: Secondary | ICD-10-CM | POA: Diagnosis not present

## 2024-07-15 DIAGNOSIS — Z853 Personal history of malignant neoplasm of breast: Secondary | ICD-10-CM | POA: Diagnosis not present

## 2024-07-15 DIAGNOSIS — R55 Syncope and collapse: Secondary | ICD-10-CM | POA: Diagnosis not present

## 2024-07-15 DIAGNOSIS — M25511 Pain in right shoulder: Secondary | ICD-10-CM | POA: Diagnosis not present

## 2024-07-15 DIAGNOSIS — M85859 Other specified disorders of bone density and structure, unspecified thigh: Secondary | ICD-10-CM | POA: Diagnosis not present

## 2024-07-15 DIAGNOSIS — Z0000 Encounter for general adult medical examination without abnormal findings: Secondary | ICD-10-CM | POA: Diagnosis not present

## 2024-07-15 DIAGNOSIS — E785 Hyperlipidemia, unspecified: Secondary | ICD-10-CM | POA: Diagnosis not present

## 2024-07-15 DIAGNOSIS — Z6830 Body mass index (BMI) 30.0-30.9, adult: Secondary | ICD-10-CM | POA: Diagnosis not present

## 2024-07-30 DIAGNOSIS — M25511 Pain in right shoulder: Secondary | ICD-10-CM | POA: Diagnosis not present

## 2024-08-05 DIAGNOSIS — M799 Soft tissue disorder, unspecified: Secondary | ICD-10-CM | POA: Diagnosis not present

## 2024-08-05 DIAGNOSIS — M25511 Pain in right shoulder: Secondary | ICD-10-CM | POA: Diagnosis not present

## 2024-08-05 DIAGNOSIS — M7541 Impingement syndrome of right shoulder: Secondary | ICD-10-CM | POA: Diagnosis not present

## 2024-08-05 DIAGNOSIS — M25611 Stiffness of right shoulder, not elsewhere classified: Secondary | ICD-10-CM | POA: Diagnosis not present

## 2024-08-07 DIAGNOSIS — M25611 Stiffness of right shoulder, not elsewhere classified: Secondary | ICD-10-CM | POA: Diagnosis not present

## 2024-08-07 DIAGNOSIS — M7541 Impingement syndrome of right shoulder: Secondary | ICD-10-CM | POA: Diagnosis not present

## 2024-08-07 DIAGNOSIS — M25511 Pain in right shoulder: Secondary | ICD-10-CM | POA: Diagnosis not present

## 2024-08-07 DIAGNOSIS — M799 Soft tissue disorder, unspecified: Secondary | ICD-10-CM | POA: Diagnosis not present

## 2024-08-08 DIAGNOSIS — E042 Nontoxic multinodular goiter: Secondary | ICD-10-CM | POA: Diagnosis not present

## 2024-08-08 DIAGNOSIS — E049 Nontoxic goiter, unspecified: Secondary | ICD-10-CM | POA: Diagnosis not present

## 2024-08-11 DIAGNOSIS — M799 Soft tissue disorder, unspecified: Secondary | ICD-10-CM | POA: Diagnosis not present

## 2024-08-11 DIAGNOSIS — M7541 Impingement syndrome of right shoulder: Secondary | ICD-10-CM | POA: Diagnosis not present

## 2024-08-11 DIAGNOSIS — M25611 Stiffness of right shoulder, not elsewhere classified: Secondary | ICD-10-CM | POA: Diagnosis not present

## 2024-08-11 DIAGNOSIS — M25511 Pain in right shoulder: Secondary | ICD-10-CM | POA: Diagnosis not present

## 2024-08-13 DIAGNOSIS — M7541 Impingement syndrome of right shoulder: Secondary | ICD-10-CM | POA: Diagnosis not present

## 2024-08-13 DIAGNOSIS — M799 Soft tissue disorder, unspecified: Secondary | ICD-10-CM | POA: Diagnosis not present

## 2024-08-13 DIAGNOSIS — M25611 Stiffness of right shoulder, not elsewhere classified: Secondary | ICD-10-CM | POA: Diagnosis not present

## 2024-08-13 DIAGNOSIS — M25511 Pain in right shoulder: Secondary | ICD-10-CM | POA: Diagnosis not present

## 2024-08-20 DIAGNOSIS — M7541 Impingement syndrome of right shoulder: Secondary | ICD-10-CM | POA: Diagnosis not present

## 2024-08-20 DIAGNOSIS — M25611 Stiffness of right shoulder, not elsewhere classified: Secondary | ICD-10-CM | POA: Diagnosis not present

## 2024-08-20 DIAGNOSIS — M25511 Pain in right shoulder: Secondary | ICD-10-CM | POA: Diagnosis not present

## 2024-08-20 DIAGNOSIS — M799 Soft tissue disorder, unspecified: Secondary | ICD-10-CM | POA: Diagnosis not present

## 2024-08-22 DIAGNOSIS — M7541 Impingement syndrome of right shoulder: Secondary | ICD-10-CM | POA: Diagnosis not present

## 2024-08-22 DIAGNOSIS — M25611 Stiffness of right shoulder, not elsewhere classified: Secondary | ICD-10-CM | POA: Diagnosis not present

## 2024-08-22 DIAGNOSIS — M25511 Pain in right shoulder: Secondary | ICD-10-CM | POA: Diagnosis not present

## 2024-08-22 DIAGNOSIS — M799 Soft tissue disorder, unspecified: Secondary | ICD-10-CM | POA: Diagnosis not present

## 2024-08-27 DIAGNOSIS — M799 Soft tissue disorder, unspecified: Secondary | ICD-10-CM | POA: Diagnosis not present

## 2024-08-27 DIAGNOSIS — M25611 Stiffness of right shoulder, not elsewhere classified: Secondary | ICD-10-CM | POA: Diagnosis not present

## 2024-08-27 DIAGNOSIS — M7541 Impingement syndrome of right shoulder: Secondary | ICD-10-CM | POA: Diagnosis not present

## 2024-08-27 DIAGNOSIS — M25511 Pain in right shoulder: Secondary | ICD-10-CM | POA: Diagnosis not present

## 2024-08-29 DIAGNOSIS — M25511 Pain in right shoulder: Secondary | ICD-10-CM | POA: Diagnosis not present

## 2024-08-29 DIAGNOSIS — M25611 Stiffness of right shoulder, not elsewhere classified: Secondary | ICD-10-CM | POA: Diagnosis not present

## 2024-08-29 DIAGNOSIS — M799 Soft tissue disorder, unspecified: Secondary | ICD-10-CM | POA: Diagnosis not present

## 2024-08-29 DIAGNOSIS — M7541 Impingement syndrome of right shoulder: Secondary | ICD-10-CM | POA: Diagnosis not present

## 2024-09-01 DIAGNOSIS — M25611 Stiffness of right shoulder, not elsewhere classified: Secondary | ICD-10-CM | POA: Diagnosis not present

## 2024-09-01 DIAGNOSIS — M25511 Pain in right shoulder: Secondary | ICD-10-CM | POA: Diagnosis not present

## 2024-09-01 DIAGNOSIS — M799 Soft tissue disorder, unspecified: Secondary | ICD-10-CM | POA: Diagnosis not present

## 2024-09-01 DIAGNOSIS — M7541 Impingement syndrome of right shoulder: Secondary | ICD-10-CM | POA: Diagnosis not present

## 2024-09-03 ENCOUNTER — Inpatient Hospital Stay: Payer: PPO | Attending: Adult Health | Admitting: Adult Health

## 2024-09-03 ENCOUNTER — Encounter: Payer: Self-pay | Admitting: Adult Health

## 2024-09-03 VITALS — BP 143/59 | HR 60 | Temp 97.6°F | Resp 17 | Wt 184.8 lb

## 2024-09-03 DIAGNOSIS — C50411 Malignant neoplasm of upper-outer quadrant of right female breast: Secondary | ICD-10-CM | POA: Diagnosis not present

## 2024-09-03 DIAGNOSIS — Z801 Family history of malignant neoplasm of trachea, bronchus and lung: Secondary | ICD-10-CM | POA: Insufficient documentation

## 2024-09-03 DIAGNOSIS — G4452 New daily persistent headache (NDPH): Secondary | ICD-10-CM | POA: Diagnosis not present

## 2024-09-03 DIAGNOSIS — Z807 Family history of other malignant neoplasms of lymphoid, hematopoietic and related tissues: Secondary | ICD-10-CM | POA: Insufficient documentation

## 2024-09-03 DIAGNOSIS — Z170 Estrogen receptor positive status [ER+]: Secondary | ICD-10-CM | POA: Diagnosis not present

## 2024-09-03 DIAGNOSIS — Z923 Personal history of irradiation: Secondary | ICD-10-CM | POA: Diagnosis not present

## 2024-09-03 DIAGNOSIS — Z1721 Progesterone receptor positive status: Secondary | ICD-10-CM | POA: Diagnosis not present

## 2024-09-03 DIAGNOSIS — Z9221 Personal history of antineoplastic chemotherapy: Secondary | ICD-10-CM | POA: Diagnosis not present

## 2024-09-03 DIAGNOSIS — Z806 Family history of leukemia: Secondary | ICD-10-CM | POA: Diagnosis not present

## 2024-09-03 DIAGNOSIS — Z87891 Personal history of nicotine dependence: Secondary | ICD-10-CM | POA: Insufficient documentation

## 2024-09-03 DIAGNOSIS — Z1732 Human epidermal growth factor receptor 2 negative status: Secondary | ICD-10-CM | POA: Insufficient documentation

## 2024-09-03 DIAGNOSIS — R42 Dizziness and giddiness: Secondary | ICD-10-CM | POA: Diagnosis not present

## 2024-09-03 DIAGNOSIS — Z8041 Family history of malignant neoplasm of ovary: Secondary | ICD-10-CM | POA: Insufficient documentation

## 2024-09-03 DIAGNOSIS — Z803 Family history of malignant neoplasm of breast: Secondary | ICD-10-CM | POA: Diagnosis not present

## 2024-09-03 DIAGNOSIS — Z79811 Long term (current) use of aromatase inhibitors: Secondary | ICD-10-CM | POA: Insufficient documentation

## 2024-09-03 NOTE — Progress Notes (Signed)
  Cancer Center Cancer Follow up:    Mercedes Planas, MD [address] [place] [address]   DIAGNOSIS: Cancer Staging  Malignant neoplasm of upper-outer quadrant of right female breast [facility]) Staging form: Breast, AJCC 7th Edition - Pathologic stage from 05/26/2016: Stage IA (T1c, N0, cM0) - Signed by Dewey Rush, MD on 09/17/2016 Specimen type: Excision Laterality: Right Tumor size (mm): 16 Residual tumor (R): R0 Estrogen receptor status: Positive Progesterone receptor status: Positive HER2 status: Negative    SUMMARY OF ONCOLOGIC HISTORY: Oncology History  Malignant neoplasm of upper-outer quadrant of right female breast (HCC)  04/05/2016 Mammogram   Asymmetry in the right breast lateral middle depth, 11 mm non-circumscribed mass ([facility] mammogram)   04/05/2016 Initial Diagnosis   Right breast biopsy: Invasive ductal carcinoma ER 90-100%, PR 20%, Ki-67 20%, HER-2 Neg, T1b N0 stage IA clinical stage   05/26/2016 Surgery   Right lumpectomy: IDC with DCIS 1.6, negative margins, 2 SLN negative, ER 100%, PR 20%, Ki-67 15%, HER-2 negative ratio 1.0 T1 BN 0 stage IA pathologic stage, Oncotype DX 26, 17% ROR   06/27/2016 - 08/29/2016 Chemotherapy   Adjuvant chemotherapy with Taxotere  and Cytoxan  4   10/05/2016 - 11/02/2016 Radiation Therapy   Adjuvant XRT   11/28/2016 -  Anti-estrogen oral therapy   Letrozole  1 mg daily, changed to Anastrozole  in March, 2018   06/09/2017 Genetic Testing   Genetic counseling and testing for hereditary cancer syndromes performed on 05/31/2017. Results are negative for pathogenic mutations in 83 genes analyzed by [facility]'s Multi-Cancers Panel. Results are dated 06/09/2017. Genes tested: ALK, APC, ATM, AXIN2, BAP1, BARD1, BLM, BMPR1A, BRCA1, BRCA2, BRIP1, CASR, CDC73, CDH1, CDK4, CDKN1B, CDKN1C, CDKN2A, CEBPA, CHEK2, CTNNA1, DICER1, DIS3L2, EGFR, EPCAM, FH, FLCN, GATA2, GPC3, GREM1, HOXB13, HRAS, KIT, MAX, MEN1, MET, MITF, MLH1, MSH2, MSH3,  MSH6, MUTYH, NBN, NF1, NF2, NTHL1, PALB2, PDGFRA, PHOX2B, PMS2, POLD1, POLE, POT1, PRKAR1A, PTCH1, PTEN, RAD50, RAD51C, RAD51D, RB1, RECQL4, RET, RUNX1, SDHA, SDHAF2, SDHB, SDHC, SDHD, SMAD4, SMARCA4, SMARCB1, SMARCE1, STK11, SUFU, TERC, TERT, TMEM127, TP53, TSC1, TSC2, VHL, WRN, WT1.       CURRENT THERAPY: observation  INTERVAL HISTORY:  Discussed the use of AI scribe software for clinical note transcription with the patient, who gave verbal consent to proceed.  History of Present Illness Mercedes Nelson is a 73 year old female who presents with dizziness.  Mercedes Nelson experiences dizziness primarily upon standing, with significant episodes in March, May, and recently at a music festival. During these episodes, Mercedes Nelson feels weak and lightheaded, sometimes requiring extended periods of rest. Mercedes Nelson experiences mild, infrequent headaches but no spinning sensation, numbness, tingling, or changes in vision, taste, or hearing.  Her medical history includes right breast invasive ductal carcinoma, diagnosed in April 2017, treated with a right lumpectomy, adjuvant chemotherapy, radiation, and antiestrogen therapy, completed in 2023. Her most recent mammogram in June 2025 showed no evidence of malignancy, and Signatera testing in March 2025 was negative.  Her primary care physician conducted an EKG in July, which was normal except for a heart rate of 54. A thyroid  ultrasound and CBC showed no significant findings. Mercedes Nelson is concerned about the dizziness due to her living situation and family history of heart disease.     Patient Active Problem List   Diagnosis Date Noted   Mixed hyperlipidemia 05/19/2020   Obesity 05/19/2020   Vitamin D deficiency 05/19/2020   Genetic testing 06/11/2017   Dehydration 07/02/2016   Port catheter in place 06/30/2016   Malignant  neoplasm of upper-outer quadrant of right female breast (HCC) 05/16/2016    is allergic to no known allergies.  MEDICAL HISTORY: Past Medical  History:  Diagnosis Date   Breast cancer (HCC) 04/20/16   Right breast invasive ductal ca   Genetic testing 06/11/2017   Mercedes Nelson underwent genetic counseling and testing for hereditary cancer syndromes on 05/31/2017. Her results were negative for mutations in all 83 genes analyzed by [facility]'s 83-gene Multi-Cancers Panel. Genes analyzed: ALK, APC, ATM, AXIN2, BAP1, BARD1, BLM, BMPR1A, BRCA1, BRCA2, BRIP1, CASR, CDC73, CDH1, CDK4, CDKN1B, CDKN1C, CDKN2A, CEBPA, CHEK2, CTNNA1, DICER1, DIS3L2, EGFR, EPCAM, FH, FLCN, G   Goiter    Headache april 2017   migraine and see starry thing vision is affected, last 30 minutes   History of urinary tract infection    Personal history of chemotherapy    Personal history of radiation therapy    PONV (postoperative nausea and vomiting)    worse headache of my life, headache 12 hours after procedure   Thyroid  disease     SURGICAL HISTORY: Past Surgical History:  Procedure Laterality Date   BREAST BIOPSY     BREAST LUMPECTOMY Right 05/26/2016   BREAST LUMPECTOMY WITH RADIOACTIVE SEED AND SENTINEL LYMPH NODE BIOPSY Right 05/26/2016   Procedure: BREAST LUMPECTOMY WITH RADIOACTIVE SEED AND SENTINEL LYMPH NODE BIOPSY, INJECT BLUE DYE RIGHT BREAST;  Surgeon: Elon Pacini, MD;  Location: [facility] OR;  Service: General;  Laterality: Right;   CERVICAL ABLATION  1986   cervical lesion   PORTACATH PLACEMENT Left 06/26/2016   Procedure: INSERTION PORT-A-CATH WITH US ;  Surgeon: Elon Pacini, MD;  Location: [facility] ORS;  Service: General;  Laterality: Left;   TUMOR REMOVAL  1985   cervix    SOCIAL HISTORY: Social History   Socioeconomic History   Marital status: Single    Spouse name: Not on file   Number of children: Not on file   Years of education: Not on file   Highest education level: Not on file  Occupational History   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  Tobacco Use   Smoking status: Former    Current packs/day: 0.00    Average  packs/day: 0.5 packs/day for 4.0 years (2.0 ttl pk-yrs)    Types: Cigarettes    Start date: 12/18/1970    Quit date: 12/18/1974    Years since quitting: 49.7   Smokeless tobacco: Never   Tobacco comments:    quit 33 years ago  Substance and Sexual Activity   Alcohol use: Yes    Alcohol/week: 0.0 standard drinks of alcohol    Comment: glass wine or beer daily   Drug use: No   Sexual activity: Not on file  Other Topics Concern   Not on file  Social History Narrative   Not on file   Social Drivers of Health   Financial Resource Strain: Not on file  Food Insecurity: Not on file  Transportation Needs: Not on file  Physical Activity: Not on file  Stress: Not on file  Social Connections: Not on file  Intimate Partner Violence: Not on file    FAMILY HISTORY: Family History  Problem Relation Age of Onset   Breast cancer Other 90       d.90s   Hypertension Mother    Hypertension Father    Prostate cancer Father 57   Heart attack Father        d.63   Hypertension Sister    Hypertension Brother  Ovarian cancer Maternal Grandmother 78       d.79   Leukemia Maternal Aunt 85       d.86   Leukemia Maternal Uncle 84       d.85   Lymphoma Brother 39       d.39   Leukemia Maternal Aunt 65       d.66   Lung cancer Maternal Uncle 82       d.83 history of smoking    Review of Systems  Constitutional:  Negative for appetite change, chills, fatigue, fever and unexpected weight change.  HENT:   Negative for hearing loss, lump/mass and trouble swallowing.   Eyes:  Negative for eye problems and icterus.  Respiratory:  Negative for chest tightness, cough and shortness of breath.   Cardiovascular:  Negative for chest pain, leg swelling and palpitations.  Gastrointestinal:  Negative for abdominal distention, abdominal pain, constipation, diarrhea, nausea and vomiting.  Endocrine: Negative for hot flashes.  Genitourinary:  Negative for difficulty urinating.   Musculoskeletal:   Negative for arthralgias.  Skin:  Negative for itching and rash.  Neurological:  Positive for headaches and light-headedness. Negative for dizziness, extremity weakness and numbness.  Hematological:  Negative for adenopathy. Does not bruise/bleed easily.  Psychiatric/Behavioral:  Negative for depression. The patient is not nervous/anxious.       PHYSICAL EXAMINATION   Onc Performance Status - 09/03/24 1033       ECOG Perf Status   ECOG Perf Status Restricted in physically strenuous activity but ambulatory and able to carry out work of a light or sedentary nature, e.g., light house work, office work      KPS SCALE   KPS % SCORE Able to carry on normal activity, minor s/s of disease          Vitals:   09/03/24 0944  BP: (!) 143/59  Pulse: 60  Resp: 17  Temp: 97.6 F (36.4 C)  SpO2: 98%    Physical Exam Constitutional:      General: Mercedes Nelson is not in acute distress.    Appearance: Normal appearance. Mercedes Nelson is not toxic-appearing.  HENT:     Head: Normocephalic and atraumatic.     Mouth/Throat:     Mouth: Mucous membranes are moist.     Pharynx: Oropharynx is clear. No oropharyngeal exudate or posterior oropharyngeal erythema.  Eyes:     General: No scleral icterus. Cardiovascular:     Rate and Rhythm: Normal rate and regular rhythm.     Pulses: Normal pulses.     Heart sounds: Normal heart sounds.  Pulmonary:     Effort: Pulmonary effort is normal.     Breath sounds: Normal breath sounds.  Chest:     Comments: Right breast s/p lumpectomy and radiation, no sign of local recurrence, left breast benign Abdominal:     General: Abdomen is flat. Bowel sounds are normal. There is no distension.     Palpations: Abdomen is soft.     Tenderness: There is no abdominal tenderness.  Musculoskeletal:        General: No swelling.     Cervical back: Neck supple.  Lymphadenopathy:     Cervical: No cervical adenopathy.     Upper Body:     Right upper body: No supraclavicular or  axillary adenopathy.     Left upper body: No supraclavicular or axillary adenopathy.  Skin:    General: Skin is warm and dry.     Findings: No rash.  Neurological:  General: No focal deficit present.     Mental Status: Mercedes Nelson is alert and oriented to person, place, and time.     Cranial Nerves: No cranial nerve deficit.     Sensory: No sensory deficit.     Motor: No weakness.     Coordination: Coordination normal.  Psychiatric:        Mood and Affect: Mood normal.        Behavior: Behavior normal.     ASSESSMENT and THERAPY PLAN:   Assessment and Plan Assessment & Plan Right breast invasive ductal carcinoma, stage IA, ER/PR positive, HER2 negative Completed treatment with lumpectomy, chemotherapy, radiation, and antiestrogen therapy. Currently under observation. Recent imaging and Signatera testing showed no malignancy.  Recurrent dizziness and lightheadedness Episodes characterized by lightheadedness, weakness, nausea, and mild headaches. Normal EKG. Differential includes orthostatic hypotension, vertigo, POTS, vagus nerve involvement, carotid artery stenosis, and central causes. - Order MRI of the brain. - Order carotid Doppler ultrasound.  Shoulder impingement syndrome with underlying osteoarthritis Symptoms exacerbated by anatomical narrowing. Physical therapy provides relief. - Continue physical therapy. - Follow up with sports medicine.    All questions were answered. The patient knows to call the clinic with any problems, questions or concerns. We can certainly see the patient much sooner if necessary.  Total encounter time:30 minutes*in face-to-face visit time, chart review, lab review, care coordination, order entry, and documentation of the encounter time.    Morna Kendall, NP 09/03/24 10:42 AM Medical Oncology and Hematology [facility] [address] [place], [place] [address] Tel. [phone]    Fax. [phone]  *Total Encounter Time as  defined by the Centers for Medicare and Medicaid Services includes, in addition to the face-to-face time of a patient visit (documented in the note above) non-face-to-face time: obtaining and reviewing outside history, ordering and reviewing medications, tests or procedures, care coordination (communications with other health care professionals or caregivers) and documentation in the medical record.

## 2024-09-05 DIAGNOSIS — M7541 Impingement syndrome of right shoulder: Secondary | ICD-10-CM | POA: Diagnosis not present

## 2024-09-05 DIAGNOSIS — M25611 Stiffness of right shoulder, not elsewhere classified: Secondary | ICD-10-CM | POA: Diagnosis not present

## 2024-09-05 DIAGNOSIS — M25511 Pain in right shoulder: Secondary | ICD-10-CM | POA: Diagnosis not present

## 2024-09-05 DIAGNOSIS — M799 Soft tissue disorder, unspecified: Secondary | ICD-10-CM | POA: Diagnosis not present

## 2024-09-10 DIAGNOSIS — M25611 Stiffness of right shoulder, not elsewhere classified: Secondary | ICD-10-CM | POA: Diagnosis not present

## 2024-09-10 DIAGNOSIS — M25511 Pain in right shoulder: Secondary | ICD-10-CM | POA: Diagnosis not present

## 2024-09-10 DIAGNOSIS — M799 Soft tissue disorder, unspecified: Secondary | ICD-10-CM | POA: Diagnosis not present

## 2024-09-10 DIAGNOSIS — M7541 Impingement syndrome of right shoulder: Secondary | ICD-10-CM | POA: Diagnosis not present

## 2024-09-11 ENCOUNTER — Ambulatory Visit (HOSPITAL_COMMUNITY)
Admission: RE | Admit: 2024-09-11 | Discharge: 2024-09-11 | Disposition: A | Discharge status: Home / Self Care | Source: Ambulatory Visit | Attending: Adult Health | Admitting: Adult Health

## 2024-09-11 DIAGNOSIS — R42 Dizziness and giddiness: Secondary | ICD-10-CM | POA: Diagnosis not present

## 2024-09-11 DIAGNOSIS — Z170 Estrogen receptor positive status [ER+]: Secondary | ICD-10-CM | POA: Diagnosis not present

## 2024-09-11 DIAGNOSIS — G4452 New daily persistent headache (NDPH): Secondary | ICD-10-CM | POA: Diagnosis not present

## 2024-09-11 DIAGNOSIS — C50411 Malignant neoplasm of upper-outer quadrant of right female breast: Secondary | ICD-10-CM | POA: Insufficient documentation

## 2024-09-12 ENCOUNTER — Ambulatory Visit
Admission: RE | Admit: 2024-09-12 | Discharge: 2024-09-12 | Disposition: A | Discharge status: Home / Self Care | Source: Ambulatory Visit | Attending: Adult Health | Admitting: Adult Health

## 2024-09-12 DIAGNOSIS — M7541 Impingement syndrome of right shoulder: Secondary | ICD-10-CM | POA: Diagnosis not present

## 2024-09-12 DIAGNOSIS — G319 Degenerative disease of nervous system, unspecified: Secondary | ICD-10-CM | POA: Diagnosis not present

## 2024-09-12 DIAGNOSIS — C50411 Malignant neoplasm of upper-outer quadrant of right female breast: Secondary | ICD-10-CM

## 2024-09-12 DIAGNOSIS — M799 Soft tissue disorder, unspecified: Secondary | ICD-10-CM | POA: Diagnosis not present

## 2024-09-12 DIAGNOSIS — R42 Dizziness and giddiness: Secondary | ICD-10-CM

## 2024-09-12 DIAGNOSIS — G4452 New daily persistent headache (NDPH): Secondary | ICD-10-CM

## 2024-09-12 DIAGNOSIS — M25511 Pain in right shoulder: Secondary | ICD-10-CM | POA: Diagnosis not present

## 2024-09-12 DIAGNOSIS — M25611 Stiffness of right shoulder, not elsewhere classified: Secondary | ICD-10-CM | POA: Diagnosis not present

## 2024-09-12 MED ORDER — GADOPICLENOL 0.5 MMOL/ML IV SOLN
8.0000 mL | Freq: Once | INTRAVENOUS | Status: AC | PRN
  Administered 2024-09-12: 8 mL via INTRAVENOUS

## 2024-09-15 ENCOUNTER — Telehealth: Payer: Self-pay | Admitting: Adult Health

## 2024-09-15 NOTE — Telephone Encounter (Signed)
 Called and reviewed doppler and MRI results with patient.  She verbalized understanding and thanks.  I recommended she f/u with her PCP for next steps moving forward about her dizziness.      Morna Kendall, NP 09/15/24 4:23 PM Medical Oncology and Hematology [facility] [address] [place], [place] [address] Tel. [phone]    Fax. [phone]

## 2024-09-16 DIAGNOSIS — M25611 Stiffness of right shoulder, not elsewhere classified: Secondary | ICD-10-CM | POA: Diagnosis not present

## 2024-09-16 DIAGNOSIS — M7541 Impingement syndrome of right shoulder: Secondary | ICD-10-CM | POA: Diagnosis not present

## 2024-09-16 DIAGNOSIS — M25511 Pain in right shoulder: Secondary | ICD-10-CM | POA: Diagnosis not present

## 2024-09-16 DIAGNOSIS — M799 Soft tissue disorder, unspecified: Secondary | ICD-10-CM | POA: Diagnosis not present

## 2024-09-18 DIAGNOSIS — M25511 Pain in right shoulder: Secondary | ICD-10-CM | POA: Diagnosis not present

## 2024-09-18 DIAGNOSIS — M799 Soft tissue disorder, unspecified: Secondary | ICD-10-CM | POA: Diagnosis not present

## 2024-09-18 DIAGNOSIS — M25611 Stiffness of right shoulder, not elsewhere classified: Secondary | ICD-10-CM | POA: Diagnosis not present

## 2024-09-18 DIAGNOSIS — M7541 Impingement syndrome of right shoulder: Secondary | ICD-10-CM | POA: Diagnosis not present

## 2024-09-23 DIAGNOSIS — M7541 Impingement syndrome of right shoulder: Secondary | ICD-10-CM | POA: Diagnosis not present

## 2024-09-23 DIAGNOSIS — M799 Soft tissue disorder, unspecified: Secondary | ICD-10-CM | POA: Diagnosis not present

## 2024-09-23 DIAGNOSIS — M25611 Stiffness of right shoulder, not elsewhere classified: Secondary | ICD-10-CM | POA: Diagnosis not present

## 2024-09-23 DIAGNOSIS — M25511 Pain in right shoulder: Secondary | ICD-10-CM | POA: Diagnosis not present

## 2024-09-25 DIAGNOSIS — M799 Soft tissue disorder, unspecified: Secondary | ICD-10-CM | POA: Diagnosis not present

## 2024-09-25 DIAGNOSIS — M25611 Stiffness of right shoulder, not elsewhere classified: Secondary | ICD-10-CM | POA: Diagnosis not present

## 2024-09-25 DIAGNOSIS — M25511 Pain in right shoulder: Secondary | ICD-10-CM | POA: Diagnosis not present

## 2024-09-25 DIAGNOSIS — M7541 Impingement syndrome of right shoulder: Secondary | ICD-10-CM | POA: Diagnosis not present

## 2024-10-08 DIAGNOSIS — M25511 Pain in right shoulder: Secondary | ICD-10-CM | POA: Diagnosis not present

## 2024-10-08 DIAGNOSIS — M1990 Unspecified osteoarthritis, unspecified site: Secondary | ICD-10-CM | POA: Diagnosis not present

## 2024-10-15 DIAGNOSIS — H40013 Open angle with borderline findings, low risk, bilateral: Secondary | ICD-10-CM | POA: Diagnosis not present

## 2024-10-16 DIAGNOSIS — Z1211 Encounter for screening for malignant neoplasm of colon: Secondary | ICD-10-CM | POA: Diagnosis not present

## 2024-11-05 DIAGNOSIS — M25511 Pain in right shoulder: Secondary | ICD-10-CM | POA: Diagnosis not present

## 2025-09-03 ENCOUNTER — Encounter: Admitting: Adult Health
